# Patient Record
Sex: Female | Born: 1965 | Race: White | Hispanic: No | Marital: Married | Smoking: Former smoker
Health system: Southern US, Community
[De-identification: ages and names within clinical notes are randomized; demographics above are authoritative.]

## PROBLEM LIST (undated history)

## (undated) DIAGNOSIS — I10 Essential (primary) hypertension: Secondary | ICD-10-CM

## (undated) DIAGNOSIS — IMO0002 Reserved for concepts with insufficient information to code with codable children: Secondary | ICD-10-CM

## (undated) DIAGNOSIS — C539 Malignant neoplasm of cervix uteri, unspecified: Secondary | ICD-10-CM

## (undated) DIAGNOSIS — K219 Gastro-esophageal reflux disease without esophagitis: Secondary | ICD-10-CM

## (undated) DIAGNOSIS — F329 Major depressive disorder, single episode, unspecified: Secondary | ICD-10-CM

## (undated) DIAGNOSIS — Z8669 Personal history of other diseases of the nervous system and sense organs: Secondary | ICD-10-CM

## (undated) DIAGNOSIS — Z862 Personal history of diseases of the blood and blood-forming organs and certain disorders involving the immune mechanism: Secondary | ICD-10-CM

## (undated) DIAGNOSIS — M1990 Unspecified osteoarthritis, unspecified site: Secondary | ICD-10-CM

## (undated) DIAGNOSIS — M419 Scoliosis, unspecified: Secondary | ICD-10-CM

## (undated) DIAGNOSIS — F32A Depression, unspecified: Secondary | ICD-10-CM

## (undated) DIAGNOSIS — J189 Pneumonia, unspecified organism: Secondary | ICD-10-CM

## (undated) DIAGNOSIS — N809 Endometriosis, unspecified: Secondary | ICD-10-CM

## (undated) DIAGNOSIS — J40 Bronchitis, not specified as acute or chronic: Secondary | ICD-10-CM

## (undated) DIAGNOSIS — F319 Bipolar disorder, unspecified: Secondary | ICD-10-CM

## (undated) HISTORY — PX: OTHER SURGICAL HISTORY: SHX169

## (undated) HISTORY — PX: ELBOW SURGERY: SHX618

---

## 1990-12-09 HISTORY — PX: CHOLECYSTECTOMY: SHX55

## 1999-09-14 ENCOUNTER — Other Ambulatory Visit: Admission: RE | Admit: 1999-09-14 | Discharge: 1999-09-14 | Payer: Self-pay | Admitting: Obstetrics and Gynecology

## 2011-03-30 ENCOUNTER — Emergency Department (HOSPITAL_COMMUNITY)
Admission: EM | Admit: 2011-03-30 | Discharge: 2011-03-30 | Disposition: A | Discharge status: Home / Self Care | Payer: 59 | Attending: Emergency Medicine | Admitting: Emergency Medicine

## 2011-03-30 ENCOUNTER — Emergency Department (HOSPITAL_COMMUNITY): Payer: 59

## 2011-03-30 DIAGNOSIS — E119 Type 2 diabetes mellitus without complications: Secondary | ICD-10-CM | POA: Insufficient documentation

## 2011-03-30 DIAGNOSIS — B9789 Other viral agents as the cause of diseases classified elsewhere: Secondary | ICD-10-CM | POA: Insufficient documentation

## 2011-03-30 DIAGNOSIS — R5381 Other malaise: Secondary | ICD-10-CM | POA: Insufficient documentation

## 2011-03-30 DIAGNOSIS — E669 Obesity, unspecified: Secondary | ICD-10-CM | POA: Insufficient documentation

## 2011-03-30 DIAGNOSIS — R509 Fever, unspecified: Secondary | ICD-10-CM | POA: Insufficient documentation

## 2011-03-30 DIAGNOSIS — R51 Headache: Secondary | ICD-10-CM | POA: Insufficient documentation

## 2011-03-30 DIAGNOSIS — R630 Anorexia: Secondary | ICD-10-CM | POA: Insufficient documentation

## 2011-03-30 DIAGNOSIS — Z79899 Other long term (current) drug therapy: Secondary | ICD-10-CM | POA: Insufficient documentation

## 2011-03-30 LAB — URINALYSIS, ROUTINE W REFLEX MICROSCOPIC
Ketones, ur: 15 mg/dL — AB
Nitrite: NEGATIVE
Specific Gravity, Urine: 1.023 (ref 1.005–1.030)
pH: 8.5 — ABNORMAL HIGH (ref 5.0–8.0)

## 2011-03-30 LAB — DIFFERENTIAL
Basophils Absolute: 0 10*3/uL (ref 0.0–0.1)
Basophils Relative: 1 % (ref 0–1)
Eosinophils Absolute: 0 10*3/uL (ref 0.0–0.7)
Neutro Abs: 3.3 10*3/uL (ref 1.7–7.7)
Neutrophils Relative %: 70 % (ref 43–77)

## 2011-03-30 LAB — CBC
Hemoglobin: 13.1 g/dL (ref 12.0–15.0)
Platelets: 221 10*3/uL (ref 150–400)
RBC: 4.48 MIL/uL (ref 3.87–5.11)
WBC: 4.7 10*3/uL (ref 4.0–10.5)

## 2011-03-30 LAB — BASIC METABOLIC PANEL
BUN: 6 mg/dL (ref 6–23)
GFR calc Af Amer: >60 mL/min (ref >60)
GFR calc non Af Amer: >60 mL/min (ref >60)
Potassium: 3.6 mEq/L (ref 3.5–5.1)
Sodium: 131 mEq/L — ABNORMAL LOW (ref 135–145)

## 2011-03-30 LAB — POCT PREGNANCY, URINE: Preg Test, Ur: NEGATIVE

## 2011-09-13 ENCOUNTER — Ambulatory Visit
Admission: RE | Admit: 2011-09-13 | Discharge: 2011-09-13 | Disposition: A | Discharge status: Home / Self Care | Payer: 59 | Source: Ambulatory Visit | Attending: Family Medicine | Admitting: Family Medicine

## 2011-09-13 ENCOUNTER — Other Ambulatory Visit: Payer: Self-pay | Admitting: Family Medicine

## 2011-09-13 DIAGNOSIS — M25551 Pain in right hip: Secondary | ICD-10-CM

## 2012-01-24 ENCOUNTER — Other Ambulatory Visit: Payer: Self-pay | Admitting: Neurosurgery

## 2012-01-24 DIAGNOSIS — M5410 Radiculopathy, site unspecified: Secondary | ICD-10-CM

## 2012-01-30 ENCOUNTER — Other Ambulatory Visit: Payer: 59

## 2012-01-31 ENCOUNTER — Ambulatory Visit
Admission: RE | Admit: 2012-01-31 | Discharge: 2012-01-31 | Disposition: A | Discharge status: Home / Self Care | Payer: Medicare Other | Source: Ambulatory Visit | Attending: Neurosurgery | Admitting: Neurosurgery

## 2012-01-31 VITALS — BP 102/61 | HR 75

## 2012-01-31 DIAGNOSIS — M5410 Radiculopathy, site unspecified: Secondary | ICD-10-CM

## 2012-01-31 MED ORDER — ONDANSETRON HCL 4 MG/2ML IJ SOLN
4.0000 mg | Freq: Once | INTRAMUSCULAR | Status: AC
  Administered 2012-01-31: 4 mg via INTRAMUSCULAR

## 2012-01-31 MED ORDER — DIAZEPAM 5 MG PO TABS
10.0000 mg | ORAL_TABLET | Freq: Once | ORAL | Status: AC
  Administered 2012-01-31: 10 mg via ORAL

## 2012-01-31 MED ORDER — HYDROMORPHONE HCL PF 2 MG/ML IJ SOLN
2.0000 mg | Freq: Once | INTRAMUSCULAR | Status: AC
  Administered 2012-01-31: 2 mg via INTRAMUSCULAR

## 2012-01-31 NOTE — Progress Notes (Signed)
Pt has been off prestig for the past 2 days.  Discharge instructions explained, consent signed and valium given.

## 2012-02-10 ENCOUNTER — Encounter (HOSPITAL_COMMUNITY): Payer: Self-pay | Admitting: Pharmacy Technician

## 2012-02-10 NOTE — Progress Notes (Signed)
Left message for Erie Noe at Dr. Trudee Grip office requesting orders.

## 2012-02-11 ENCOUNTER — Encounter (HOSPITAL_COMMUNITY)
Admission: RE | Admit: 2012-02-11 | Discharge: 2012-02-11 | Disposition: A | Discharge status: Home / Self Care | Payer: 59 | Source: Ambulatory Visit | Attending: Neurosurgery | Admitting: Neurosurgery

## 2012-02-11 ENCOUNTER — Encounter (HOSPITAL_COMMUNITY): Payer: Self-pay

## 2012-02-11 ENCOUNTER — Other Ambulatory Visit: Payer: Self-pay

## 2012-02-11 HISTORY — DX: Bipolar disorder, unspecified: F31.9

## 2012-02-11 HISTORY — DX: Reserved for concepts with insufficient information to code with codable children: IMO0002

## 2012-02-11 HISTORY — DX: Depression, unspecified: F32.A

## 2012-02-11 HISTORY — DX: Major depressive disorder, single episode, unspecified: F32.9

## 2012-02-11 LAB — CBC
MCHC: 33.2 g/dL (ref 30.0–36.0)
Platelets: 281 10*3/uL (ref 150–400)
RDW: 13.5 % (ref 11.5–15.5)

## 2012-02-11 LAB — DIFFERENTIAL
Basophils Absolute: 0 10*3/uL (ref 0.0–0.1)
Basophils Relative: 1 % (ref 0–1)
Eosinophils Relative: 3 % (ref 0–5)
Monocytes Absolute: 1 10*3/uL (ref 0.1–1.0)

## 2012-02-11 LAB — TYPE AND SCREEN
ABO/RH(D): O POS
Antibody Screen: NEGATIVE

## 2012-02-11 LAB — BASIC METABOLIC PANEL
BUN: 12 mg/dL (ref 6–23)
Creatinine, Ser: 0.57 mg/dL (ref 0.50–1.10)
GFR calc Af Amer: >90 mL/min (ref >90)
GFR calc non Af Amer: >90 mL/min (ref >90)
Potassium: 4 mEq/L (ref 3.5–5.1)

## 2012-02-11 LAB — SURGICAL PCR SCREEN: MRSA, PCR: NEGATIVE

## 2012-02-11 NOTE — Pre-Procedure Instructions (Signed)
20 Valerie Diaz  02/11/2012   Your procedure is scheduled on:  Friday March 15  Report to [facility] at 5:30 AM.  Call this number if you have problems the morning of surgery: [phone]   Remember:   Do not eat food:After Midnight.  May have clear liquids: up to 4 Hours before arrival.  Clear liquids include soda, tea, black coffee, apple or grape juice, broth.  Take these medicines the morning of surgery with A SIP OF WATER: Depakote, Hydrocodone,    Do not wear jewelry, make-up or nail polish.  Do not wear lotions, powders, or perfumes. You may wear deodorant.  Do not shave 48 hours prior to surgery.  Do not bring valuables to the hospital.  Contacts, dentures or bridgework may not be worn into surgery.  Leave suitcase in the car. After surgery it may be brought to your room.  For patients admitted to the hospital, checkout time is 11:00 AM the day of discharge.   Patients discharged the day of surgery will not be allowed to drive home.  Name and phone number of your driver: NA  Special Instructions: CHG Shower Use Special Wash: 1/2 bottle night before surgery and 1/2 bottle morning of surgery.   Please read over the following fact sheets that you were given: Pain Booklet, Coughing and Deep Breathing, Blood Transfusion Information and Surgical Site Infection Prevention

## 2012-02-12 NOTE — Consult Note (Addendum)
Anesthesia:  Patient is a 46 year old female scheduled for a right L3-4, L4-5 decompression laminectomy, TLIF on 02/21/12.  Other history includes DM2, Bipolar disorder, depression, non-smoker.  Labs noted.  Glucose 166, otherwise CBC/BMET WNL.    CXR from 03/30/11 showed no active disease.  EKG from 02/11/12 shows NSR, minimal voltage criteria for LVH (may be normal variant), cannot rule out anterior infarct (age undetermined). Currently, there are no comparison EKGs.  No CV symptoms reported at her PAT appointment.  Will attempt to contact her pre-operatively to clinically correlate.  Addendum:  02/13/12 1800  Left message for patient to call me.

## 2012-02-20 MED ORDER — SODIUM CHLORIDE 0.9 % IV SOLN
1500.0000 mg | Freq: Once | INTRAVENOUS | Status: AC
  Administered 2012-02-21: 1500 mg via INTRAVENOUS
  Filled 2012-02-20: qty 1500

## 2012-02-20 MED ORDER — DEXAMETHASONE SODIUM PHOSPHATE 10 MG/ML IJ SOLN
10.0000 mg | Freq: Once | INTRAMUSCULAR | Status: AC
  Administered 2012-02-21: 10 mg via INTRAVENOUS
  Filled 2012-02-20: qty 1

## 2012-02-21 ENCOUNTER — Encounter (HOSPITAL_COMMUNITY)
Admission: RE | Disposition: A | Discharge status: Home Health | Payer: Self-pay | Source: Ambulatory Visit | Attending: Neurosurgery

## 2012-02-21 ENCOUNTER — Inpatient Hospital Stay (HOSPITAL_COMMUNITY): Payer: 59

## 2012-02-21 ENCOUNTER — Inpatient Hospital Stay (HOSPITAL_COMMUNITY)
Admission: RE | Admit: 2012-02-21 | Discharge: 2012-02-26 | DRG: 460 | Disposition: A | Discharge status: Home Health | Payer: 59 | Source: Ambulatory Visit | Attending: Neurosurgery | Admitting: Neurosurgery

## 2012-02-21 ENCOUNTER — Inpatient Hospital Stay (HOSPITAL_COMMUNITY): Payer: 59 | Admitting: Vascular Surgery

## 2012-02-21 ENCOUNTER — Encounter (HOSPITAL_COMMUNITY): Payer: Self-pay | Admitting: Vascular Surgery

## 2012-02-21 DIAGNOSIS — M47817 Spondylosis without myelopathy or radiculopathy, lumbosacral region: Principal | ICD-10-CM | POA: Diagnosis present

## 2012-02-21 DIAGNOSIS — E119 Type 2 diabetes mellitus without complications: Secondary | ICD-10-CM | POA: Diagnosis present

## 2012-02-21 DIAGNOSIS — Z880 Allergy status to penicillin: Secondary | ICD-10-CM

## 2012-02-21 DIAGNOSIS — Z91013 Allergy to seafood: Secondary | ICD-10-CM

## 2012-02-21 DIAGNOSIS — M48061 Spinal stenosis, lumbar region without neurogenic claudication: Secondary | ICD-10-CM

## 2012-02-21 DIAGNOSIS — F319 Bipolar disorder, unspecified: Secondary | ICD-10-CM | POA: Diagnosis present

## 2012-02-21 DIAGNOSIS — Z79899 Other long term (current) drug therapy: Secondary | ICD-10-CM

## 2012-02-21 DIAGNOSIS — G8918 Other acute postprocedural pain: Secondary | ICD-10-CM | POA: Diagnosis not present

## 2012-02-21 LAB — GLUCOSE, CAPILLARY: Glucose-Capillary: 217 mg/dL — ABNORMAL HIGH (ref 70–99)

## 2012-02-21 SURGERY — MINIMALLY INVASIVE (MIS) TRANSFORAMINAL LUMBAR INTERBODY FUSION (TLIF) 2 LEVEL
Anesthesia: General | Site: Back | Laterality: Right | Wound class: Clean

## 2012-02-21 MED ORDER — HYDROMORPHONE HCL PF 1 MG/ML IJ SOLN
INTRAMUSCULAR | Status: AC
  Administered 2012-02-21: 1 mg via INTRAMUSCULAR
  Filled 2012-02-21: qty 1

## 2012-02-21 MED ORDER — VENLAFAXINE HCL ER 37.5 MG PO CP24: 37.5000 mg | ORAL_CAPSULE | Freq: Every day | ORAL | Status: DC

## 2012-02-21 MED ORDER — SODIUM CHLORIDE 0.9 % IV SOLN
1250.0000 mg | Freq: Once | INTRAVENOUS | Status: AC
  Administered 2012-02-21: 1250 mg via INTRAVENOUS
  Filled 2012-02-21 (×2): qty 1250

## 2012-02-21 MED ORDER — DEXAMETHASONE SODIUM PHOSPHATE 4 MG/ML IJ SOLN: 4.0000 mg | Freq: Four times a day (QID) | INTRAMUSCULAR | Status: AC

## 2012-02-21 MED ORDER — LACTATED RINGERS IV SOLN
INTRAVENOUS | Status: DC | PRN
  Administered 2012-02-21 (×4): via INTRAVENOUS

## 2012-02-21 MED ORDER — PHENYLEPHRINE HCL 10 MG/ML IJ SOLN
INTRAMUSCULAR | Status: DC | PRN
  Administered 2012-02-21 (×8): 80 ug via INTRAVENOUS

## 2012-02-21 MED ORDER — DEXAMETHASONE 4 MG PO TABS
4.0000 mg | ORAL_TABLET | Freq: Four times a day (QID) | ORAL | Status: AC
  Administered 2012-02-21 (×2): 4 mg via ORAL
  Filled 2012-02-21 (×2): qty 1

## 2012-02-21 MED ORDER — POTASSIUM CHLORIDE IN NACL 20-0.45 MEQ/L-% IV SOLN
INTRAVENOUS | Status: DC
  Administered 2012-02-21: 22:00:00 via INTRAVENOUS
  Filled 2012-02-21 (×11): qty 1000

## 2012-02-21 MED ORDER — PHENOL 1.4 % MT LIQD: 1.0000 | OROMUCOSAL | Status: DC | PRN

## 2012-02-21 MED ORDER — SODIUM CHLORIDE 0.9 % IJ SOLN: 3.0000 mL | INTRAMUSCULAR | Status: DC | PRN

## 2012-02-21 MED ORDER — MORPHINE SULFATE 2 MG/ML IJ SOLN: 0.0500 mg/kg | INTRAMUSCULAR | Status: DC | PRN

## 2012-02-21 MED ORDER — METFORMIN HCL 500 MG PO TABS
1000.0000 mg | ORAL_TABLET | Freq: Two times a day (BID) | ORAL | Status: DC
  Administered 2012-02-21 – 2012-02-26 (×10): 1000 mg via ORAL
  Filled 2012-02-21 (×12): qty 2

## 2012-02-21 MED ORDER — HYDROMORPHONE HCL PF 1 MG/ML IJ SOLN
0.2500 mg | INTRAMUSCULAR | Status: DC | PRN
  Administered 2012-02-21 (×4): 0.5 mg via INTRAVENOUS

## 2012-02-21 MED ORDER — ONDANSETRON HCL 4 MG/2ML IJ SOLN: 4.0000 mg | Freq: Once | INTRAMUSCULAR | Status: DC | PRN

## 2012-02-21 MED ORDER — DIAZEPAM 5 MG PO TABS
ORAL_TABLET | ORAL | Status: AC
  Administered 2012-02-21: 5 mg via ORAL
  Filled 2012-02-21: qty 1

## 2012-02-21 MED ORDER — HYDROMORPHONE HCL PF 1 MG/ML IJ SOLN
INTRAMUSCULAR | Status: AC
  Administered 2012-02-21: 0.5 mg via INTRAVENOUS
  Filled 2012-02-21: qty 1

## 2012-02-21 MED ORDER — LEUPROLIDE ACETATE 3.75 MG IM KIT: 3.7500 mg | PACK | INTRAMUSCULAR | Status: DC

## 2012-02-21 MED ORDER — INSULIN ASPART 100 UNIT/ML ~~LOC~~ SOLN
0.0000 [IU] | Freq: Every day | SUBCUTANEOUS | Status: DC
  Administered 2012-02-21: 3 [IU] via SUBCUTANEOUS
  Administered 2012-02-24: 2 [IU] via SUBCUTANEOUS
  Filled 2012-02-21: qty 3
  Filled 2012-02-21: qty 0.05

## 2012-02-21 MED ORDER — ACETAMINOPHEN 650 MG RE SUPP: 650.0000 mg | RECTAL | Status: DC | PRN

## 2012-02-21 MED ORDER — SODIUM CHLORIDE 0.9 % IR SOLN
Status: DC | PRN
  Administered 2012-02-21: 09:00:00

## 2012-02-21 MED ORDER — NEOSTIGMINE METHYLSULFATE 1 MG/ML IJ SOLN
INTRAMUSCULAR | Status: DC | PRN
  Administered 2012-02-21: 5 mg via INTRAVENOUS

## 2012-02-21 MED ORDER — HETASTARCH-ELECTROLYTES 6 % IV SOLN
INTRAVENOUS | Status: DC | PRN
  Administered 2012-02-21: 10:00:00 via INTRAVENOUS

## 2012-02-21 MED ORDER — SUCCINYLCHOLINE CHLORIDE 20 MG/ML IJ SOLN
INTRAMUSCULAR | Status: DC | PRN
  Administered 2012-02-21: 100 mg via INTRAVENOUS

## 2012-02-21 MED ORDER — ROCURONIUM BROMIDE 100 MG/10ML IV SOLN
INTRAVENOUS | Status: DC | PRN
  Administered 2012-02-21: 20 mg via INTRAVENOUS
  Administered 2012-02-21: 30 mg via INTRAVENOUS
  Administered 2012-02-21: 50 mg via INTRAVENOUS
  Administered 2012-02-21: 10 mg via INTRAVENOUS

## 2012-02-21 MED ORDER — SODIUM CHLORIDE 0.9 % IV SOLN: 250.0000 mL | INTRAVENOUS | Status: DC

## 2012-02-21 MED ORDER — MIDAZOLAM HCL 5 MG/5ML IJ SOLN
INTRAMUSCULAR | Status: DC | PRN
  Administered 2012-02-21: 2 mg via INTRAVENOUS

## 2012-02-21 MED ORDER — ONDANSETRON HCL 4 MG/2ML IJ SOLN
INTRAMUSCULAR | Status: DC | PRN
  Administered 2012-02-21 (×2): 4 mg via INTRAVENOUS

## 2012-02-21 MED ORDER — INSULIN ASPART 100 UNIT/ML ~~LOC~~ SOLN
6.0000 [IU] | Freq: Three times a day (TID) | SUBCUTANEOUS | Status: DC
  Administered 2012-02-22 – 2012-02-25 (×7): 6 [IU] via SUBCUTANEOUS
  Filled 2012-02-21: qty 0.06
  Filled 2012-02-21: qty 3

## 2012-02-21 MED ORDER — HYDROCODONE-ACETAMINOPHEN 5-325 MG PO TABS
1.0000 | ORAL_TABLET | ORAL | Status: DC | PRN
  Administered 2012-02-21 – 2012-02-25 (×11): 2 via ORAL
  Filled 2012-02-21 (×13): qty 2

## 2012-02-21 MED ORDER — BUPIVACAINE HCL (PF) 0.5 % IJ SOLN
INTRAMUSCULAR | Status: DC | PRN
  Administered 2012-02-21: 34 mL

## 2012-02-21 MED ORDER — SODIUM CHLORIDE 0.9 % IJ SOLN
3.0000 mL | Freq: Two times a day (BID) | INTRAMUSCULAR | Status: DC
  Administered 2012-02-22 – 2012-02-26 (×9): 3 mL via INTRAVENOUS

## 2012-02-21 MED ORDER — MENTHOL 3 MG MT LOZG: 1.0000 | LOZENGE | OROMUCOSAL | Status: DC | PRN

## 2012-02-21 MED ORDER — ONDANSETRON HCL 4 MG/2ML IJ SOLN: 4.0000 mg | INTRAMUSCULAR | Status: DC | PRN

## 2012-02-21 MED ORDER — VENLAFAXINE HCL ER 75 MG PO CP24
75.0000 mg | ORAL_CAPSULE | Freq: Every day | ORAL | Status: DC
  Administered 2012-02-22 – 2012-02-26 (×5): 75 mg via ORAL
  Filled 2012-02-21 (×6): qty 1

## 2012-02-21 MED ORDER — EPHEDRINE SULFATE 50 MG/ML IJ SOLN
INTRAMUSCULAR | Status: DC | PRN
  Administered 2012-02-21: 5 mg via INTRAVENOUS
  Administered 2012-02-21: 10 mg via INTRAVENOUS
  Administered 2012-02-21: 7.5 mg via INTRAVENOUS
  Administered 2012-02-21: 5 mg via INTRAVENOUS

## 2012-02-21 MED ORDER — ACETAMINOPHEN 325 MG PO TABS: 650.0000 mg | ORAL_TABLET | ORAL | Status: DC | PRN

## 2012-02-21 MED ORDER — DIVALPROEX SODIUM 500 MG PO DR TAB
1500.0000 mg | DELAYED_RELEASE_TABLET | Freq: Every day | ORAL | Status: DC
  Administered 2012-02-21 – 2012-02-25 (×5): 1500 mg via ORAL
  Filled 2012-02-21 (×6): qty 3

## 2012-02-21 MED ORDER — SODIUM CHLORIDE 0.9 % IV SOLN
INTRAVENOUS | Status: AC
  Filled 2012-02-21: qty 500

## 2012-02-21 MED ORDER — INSULIN ASPART 100 UNIT/ML ~~LOC~~ SOLN
0.0000 [IU] | Freq: Three times a day (TID) | SUBCUTANEOUS | Status: DC
  Administered 2012-02-22 – 2012-02-23 (×4): 3 [IU] via SUBCUTANEOUS
  Administered 2012-02-23: 4 [IU] via SUBCUTANEOUS
  Administered 2012-02-25: 7 [IU] via SUBCUTANEOUS
  Filled 2012-02-21: qty 0.2
  Filled 2012-02-21: qty 3

## 2012-02-21 MED ORDER — LIDOCAINE HCL 4 % MT SOLN
OROMUCOSAL | Status: DC | PRN
  Administered 2012-02-21: 4 mL via TOPICAL

## 2012-02-21 MED ORDER — HEMOSTATIC AGENTS (NO CHARGE) OPTIME
TOPICAL | Status: DC | PRN
  Administered 2012-02-21: 1 via TOPICAL

## 2012-02-21 MED ORDER — PROPOFOL 10 MG/ML IV EMUL
INTRAVENOUS | Status: DC | PRN
  Administered 2012-02-21: 200 mg via INTRAVENOUS

## 2012-02-21 MED ORDER — DIAZEPAM 5 MG PO TABS
5.0000 mg | ORAL_TABLET | Freq: Four times a day (QID) | ORAL | Status: DC | PRN
  Administered 2012-02-21 – 2012-02-25 (×6): 5 mg via ORAL
  Filled 2012-02-21 (×6): qty 1

## 2012-02-21 MED ORDER — GLIPIZIDE 5 MG PO TABS
5.0000 mg | ORAL_TABLET | Freq: Every day | ORAL | Status: DC
  Administered 2012-02-22 – 2012-02-26 (×5): 5 mg via ORAL
  Filled 2012-02-21 (×6): qty 1

## 2012-02-21 MED ORDER — 0.9 % SODIUM CHLORIDE (POUR BTL) OPTIME
TOPICAL | Status: DC | PRN
  Administered 2012-02-21: 1000 mL

## 2012-02-21 MED ORDER — HYDROMORPHONE HCL PF 1 MG/ML IJ SOLN
1.0000 mg | INTRAMUSCULAR | Status: DC | PRN
  Administered 2012-02-21 – 2012-02-22 (×4): 1 mg via INTRAMUSCULAR
  Filled 2012-02-21 (×3): qty 1

## 2012-02-21 MED ORDER — GLYCOPYRROLATE 0.2 MG/ML IJ SOLN
INTRAMUSCULAR | Status: DC | PRN
  Administered 2012-02-21: .8 mg via INTRAVENOUS

## 2012-02-21 MED ORDER — BACITRACIN 50000 UNITS IM SOLR
INTRAMUSCULAR | Status: AC
  Filled 2012-02-21: qty 1

## 2012-02-21 MED ORDER — THROMBIN 5000 UNITS EX KIT
PACK | CUTANEOUS | Status: DC | PRN
  Administered 2012-02-21 (×4): 5000 [IU] via TOPICAL

## 2012-02-21 MED ORDER — SUFENTANIL CITRATE 50 MCG/ML IV SOLN
INTRAVENOUS | Status: DC | PRN
  Administered 2012-02-21 (×4): 10 ug via INTRAVENOUS
  Administered 2012-02-21: 30 ug via INTRAVENOUS
  Administered 2012-02-21 (×3): 10 ug via INTRAVENOUS

## 2012-02-21 SURGICAL SUPPLY — 81 items
ADH SKN CLS APL DERMABOND .7 (GAUZE/BANDAGES/DRESSINGS) ×1
APL SKNCLS STERI-STRIP NONHPOA (GAUZE/BANDAGES/DRESSINGS) ×2
BAG DECANTER FOR FLEXI CONT (MISCELLANEOUS) ×2 IMPLANT
BENZOIN TINCTURE PRP APPL 2/3 (GAUZE/BANDAGES/DRESSINGS) ×3 IMPLANT
BLADE SURG ROTATE 9660 (MISCELLANEOUS) IMPLANT
BONE EQUIVA 5CC (Bone Implant) ×1 IMPLANT
BRUSH SCRUB EZ PLAIN DRY (MISCELLANEOUS) ×2 IMPLANT
BUR CUTTER 7.0 ROUND (BURR) ×1 IMPLANT
BUR MATCHSTICK NEURO 3.0 LAGG (BURR) ×1 IMPLANT
CLIP NEUROVISION LG (CLIP) ×1 IMPLANT
CLOTH BEACON ORANGE TIMEOUT ST (SAFETY) ×2 IMPLANT
CONT SPEC 4OZ CLIKSEAL STRL BL (MISCELLANEOUS) ×2 IMPLANT
COVER BACK TABLE 24X17X13 BIG (DRAPES) IMPLANT
COVER TABLE BACK 60X90 (DRAPES) ×2 IMPLANT
DERMABOND ADVANCED (GAUZE/BANDAGES/DRESSINGS) ×1
DERMABOND ADVANCED .7 DNX12 (GAUZE/BANDAGES/DRESSINGS) IMPLANT
DRAPE C-ARM 42X72 X-RAY (DRAPES) ×2 IMPLANT
DRAPE C-ARMOR (DRAPES) ×2 IMPLANT
DRAPE LAPAROTOMY 100X72X124 (DRAPES) ×2 IMPLANT
DRAPE SURG 17X23 STRL (DRAPES) ×4 IMPLANT
DRESSING TELFA 8X3 (GAUZE/BANDAGES/DRESSINGS) ×2 IMPLANT
DYNAMIC STIM CLIP ×1 IMPLANT
ELECT BLADE 4.0 EZ CLEAN MEGAD (MISCELLANEOUS) ×2
ELECT REM PT RETURN 9FT ADLT (ELECTROSURGICAL) ×2
ELECTRODE BLDE 4.0 EZ CLN MEGD (MISCELLANEOUS) ×1 IMPLANT
ELECTRODE REM PT RTRN 9FT ADLT (ELECTROSURGICAL) ×1 IMPLANT
EVACUATOR 1/8 PVC DRAIN (DRAIN) ×1 IMPLANT
GAUZE SPONGE 4X4 16PLY XRAY LF (GAUZE/BANDAGES/DRESSINGS) ×2 IMPLANT
GLOVE BIO SURGEON STRL SZ8 (GLOVE) IMPLANT
GLOVE BIOGEL PI IND STRL 6.5 (GLOVE) IMPLANT
GLOVE BIOGEL PI IND STRL 7.0 (GLOVE) IMPLANT
GLOVE BIOGEL PI INDICATOR 6.5 (GLOVE) ×1
GLOVE BIOGEL PI INDICATOR 7.0 (GLOVE) ×3
GLOVE ECLIPSE 7.5 STRL STRAW (GLOVE) ×2 IMPLANT
GLOVE ECLIPSE 8.5 STRL (GLOVE) ×1 IMPLANT
GLOVE EXAM NITRILE LRG STRL (GLOVE) IMPLANT
GLOVE EXAM NITRILE MD LF STRL (GLOVE) ×1 IMPLANT
GLOVE EXAM NITRILE XL STR (GLOVE) ×1 IMPLANT
GLOVE EXAM NITRILE XS STR PU (GLOVE) IMPLANT
GLOVE SS BIOGEL STRL SZ 6.5 (GLOVE) IMPLANT
GLOVE SUPERSENSE BIOGEL SZ 6.5 (GLOVE) ×3
GLOVE SURG SS PI 6.5 STRL IVOR (GLOVE) ×5 IMPLANT
GOWN BRE IMP SLV AUR LG STRL (GOWN DISPOSABLE) ×4 IMPLANT
GOWN BRE IMP SLV AUR XL STRL (GOWN DISPOSABLE) ×4 IMPLANT
GOWN STRL REIN 2XL LVL4 (GOWN DISPOSABLE) ×2 IMPLANT
GUIDEWIRE NITINOL BEVEL TIP (WIRE) ×3 IMPLANT
HOOP SHIMS ×2 IMPLANT
KIT BASIN OR (CUSTOM PROCEDURE TRAY) ×2 IMPLANT
KIT NDL NVM5 EMG ELECT (KITS) IMPLANT
KIT NEEDLE NVM5 EMG ELECT (KITS) ×1 IMPLANT
KIT NEEDLE NVM5 EMG ELECTRODE (KITS) ×1
KIT ROOM TURNOVER OR (KITS) ×2 IMPLANT
LIGHT SOURCE ANGLE TIP STR 7FT (MISCELLANEOUS) ×2 IMPLANT
LUCENT STRAIGHT 10X22X8 (Set) ×2 IMPLANT
MAS TLIF HOOP SHIM (KITS) ×2 IMPLANT
NDL I-PASS III (NEEDLE) IMPLANT
NEEDLE HYPO 22GX1.5 SAFETY (NEEDLE) ×2 IMPLANT
NEEDLE I-PASS III (NEEDLE) ×2 IMPLANT
NS IRRIG 1000ML POUR BTL (IV SOLUTION) ×2 IMPLANT
PACK LAMINECTOMY NEURO (CUSTOM PROCEDURE TRAY) ×2 IMPLANT
PROBE ×1 IMPLANT
PROBE BALL TIP NVM5 SNG USE (BALLOONS) ×2 IMPLANT
ROD DUAL BALL 42.5MM (Rod) ×2 IMPLANT
ROD PRE BENT SPHERX 70MM (Rod) ×1 IMPLANT
SCREW CANNULATED 6.5X45 (Screw) ×3 IMPLANT
SCREW LOCK IMPLANT 6.25 (Screw) ×12 IMPLANT
SCREW SHANK 40MMX6.5 (Screw) ×3 IMPLANT
SCREW TULIP IMPLANT 6.25 (Screw) ×2 IMPLANT
SPONGE GAUZE 4X4 12PLY (GAUZE/BANDAGES/DRESSINGS) ×2 IMPLANT
SPONGE LAP 4X18 X RAY DECT (DISPOSABLE) IMPLANT
SPONGE SURGIFOAM ABS GEL SZ50 (HEMOSTASIS) ×2 IMPLANT
STAPLER SKIN PROX WIDE 3.9 (STAPLE) ×1 IMPLANT
STRIP CLOSURE SKIN 1/2X4 (GAUZE/BANDAGES/DRESSINGS) ×1 IMPLANT
SUT VIC AB 2-0 OS6 18 (SUTURE) ×10 IMPLANT
SUT VIC AB 3-0 CP2 18 (SUTURE) ×2 IMPLANT
SYR 20ML ECCENTRIC (SYRINGE) ×2 IMPLANT
TOWEL OR 17X24 6PK STRL BLUE (TOWEL DISPOSABLE) ×2 IMPLANT
TOWEL OR 17X26 10 PK STRL BLUE (TOWEL DISPOSABLE) ×2 IMPLANT
TRAP SPECIMEN MUCOUS 40CC (MISCELLANEOUS) ×1 IMPLANT
TRAY FOLEY CATH 14FRSI W/METER (CATHETERS) ×1 IMPLANT
WATER STERILE IRR 1000ML POUR (IV SOLUTION) ×2 IMPLANT

## 2012-02-21 NOTE — Progress Notes (Signed)
ANTIBIOTIC CONSULT NOTE - INITIAL  Pharmacy Consult for Vancomycin post-op  Indication: Post-Op Spinal Surgery Empiric Antibitotic Prophylaxis  Allergies  Allergen Reactions  . Shellfish Allergy Hives and Shortness Of Breath    Throat swelling  . Penicillins Hives    Patient Measurements: Weight: 247 lb (112.038 kg)  Vital Signs: Temp: 97.8 F (36.6 C) (03/15 1615) Temp src: Oral (03/15 0609) BP: 106/67 mmHg (03/15 1615) Pulse Rate: 113  (03/15 1615) Intake/Output from previous day:   Intake/Output from this shift: Total I/O In: 3800 [I.V.:3300; IV Piggyback:500] Out: 1250 [Urine:550; Blood:700]  Labs: No results found for this basename: WBC:3,HGB:3,PLT:3,LABCREA:3,CREATININE:3 in the last 72 hours CrCl is unknown because there is no height on file for the current visit. No results found for this basename: VANCOTROUGH:2,VANCOPEAK:2,VANCORANDOM:2,GENTTROUGH:2,GENTPEAK:2,GENTRANDOM:2,TOBRATROUGH:2,TOBRAPEAK:2,TOBRARND:2,AMIKACINPEAK:2,AMIKACINTROU:2,AMIKACIN:2, in the last 72 hours   Microbiology: Recent Results (from the past 720 hour(s))  SURGICAL PCR SCREEN     Status: Normal   Collection Time   02/11/12  3:29 PM      Component Value Range Status Comment   MRSA, PCR NEGATIVE  NEGATIVE  Final    Staphylococcus aureus NEGATIVE  NEGATIVE  Final     Medical History: Past Medical History  Diagnosis Date  . Diabetes mellitus   . Bulging disc   . Bipolar disorder   . Depression     Medications:  Anti-infectives     Start     Dose/Rate Route Frequency Ordered Stop   02/21/12 0903   bacitracin 50,000 Units in sodium chloride irrigation 0.9 % 500 mL irrigation  Status:  Discontinued          As needed 02/21/12 0903 02/21/12 1313   02/21/12 0729   bacitracin 16109 UNITS injection     Comments: Worthy Flank: cabinet override         02/21/12 0729 02/21/12 1929   02/20/12 1000   vancomycin (VANCOCIN) 1,500 mg in sodium chloride 0.9 % 500 mL IVPB        1,500  mg 250 mL/hr over 120 Minutes Intravenous  Once 02/20/12 0945 02/21/12 0808         Assessment: 46 year old female s/p Right L3-4  L4-5 lift/interbody spacing and Left L3-4 L4-5 screw fixation for spondylosis and stenosis to receive Vancomycin x1 dose post-op for antibiotic prophylaxis. No drain was placed. AET = 1320. Pre-op vancomycin 1500mg  given at 0808. Afebrile. No culture data.  WBC wnl on 02/11/12. SCr 0.57 on 02/11/12.  No repeat labs available.   Plan:  1. Vancomycin 1250mg  IV x1 at 2000.  2. Pharmacy will sign off. Please re-consult if needed.   Link Snuffer, PharmD, BCPS Clinical Pharmacist [phone] 02/21/2012,5:42 PM

## 2012-02-21 NOTE — Op Note (Signed)
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 lift with peek interbody spacer followed by left L3-4 L4-5 percutaneous pedicle screw fixation with invasive percutaneous pedicle screw system Surgeon: Berdie Malter Assistant: Pool  After being placed in the prone position the patient's back was prepped and draped in the usual sterile fashion. Localizing fluoroscopy was used prior to incision to identify the appropriate level. Entry points were made for Jamshidi needles lateral to the pedicle at L3 and L4. Guidewire was placed through the Jamshidi needles were still followed in good position. The incision was connected and then sequential dilation was carried out over the guidewires. We placed 6 5 x 40 mm screws on that side with the retractor blades attached. We attached the retractor in standard fashion. We then secured the retractor to the table. We then cleaned of soft tissue on the lamina and facet joint. We then performed a generous laminotomy by removing the inferior 80% of the L3 lamina the medial three quarters of the facet joint and the superior one third of the L4 lamina. Residual bone and ligamentum flavum removed in a piecemeal fashion. We then identified the disc space and thoroughly cleaned out with a variety of pituitary rongeurs and curettes. He was markedly collapsed we distracted up to an 8 mm size and felt that this was a good choice for interbody spacer. We chose an 8 mm peek wrapped and filled with a mixture of autologous bone and the bone and impacted without difficulty. He places a mixture deep within the interspace prior to placing the spacer to help with interbody fusion. We then irrigated copiously controlled the bleeding with upper coagulation and Gelfoam. We then removed the hoop shims from the L3 screw. We removed the retractor completely and then placed a wire down the pedicle of L5. We placed a 6 5 x 40 mm  screw at the left attached and secured to the screws in standard fashion. We then secured the retractor back to the table and distracted and all distract directions. We chose this time that the L4 screw he changes position consistent with a fracture of the pedicle. This was dealt with later in the case. We spent a few minutes defining anatomy L4-5 by removing soft tissue. Once again performed a generous laminotomy by removing the inferior half of the L4 lamina the medial three quarters of the facet joint the superior one third of the L5 lamina. Once again residual bone and ligamentum flavum removed in a piecemeal fashion. At this time the disc L4-5 was entered and thoroughly cleaned out with rongeurs and pituitaries. We then distracted this disc space up to the 8 mm size and felt that this was a good fit this level as well. We used a variety of instruments to decorticate and refill removed the disc. We placed a mixture of autologous bone and morselized allograft into the interspace and then placed a peek interbody spacer this level without difficulty. Fossae showed to be in good position. We then removed the screw from L4 and explored the hole. We felt we could not place of the screws we therefore chose appropriate length dual ball rod and secured to the screws at L3 and L5 on the side. We then did top loading the set screws and secured them with torque and counter torque for final tightening. Farrel Demark showed them to be in excellent position. We then irrigated the wound copiously with antibiotic irrigation. We closed the  wound in multiple layers of Vicryl on the muscle fascia subcutaneous and subcuticular tissues. We then placed percutaneous pedicle screws at L3-L4 and L5 on the left side. This was done in standard fashion patella was attached to 6 5 x 45 mm screws at each level. We then passed the subfascial plane without difficulty and secured to the top of the screws. We did tightening and final tightening and  remove the talus. Final fluoroscopy with excellent. We irrigated once once more and closed in multiple layers of Vicryl. We placed about and Steri-Strips on the skin. Sterile dressings were applied the patient was extubated and covered stable condition.

## 2012-02-21 NOTE — Anesthesia Procedure Notes (Signed)
Procedure Name: Intubation Date/Time: 02/21/2012 7:47 AM Performed by: Glendora Score A Pre-anesthesia Checklist: Patient identified, Emergency Drugs available, Suction available and Patient being monitored Patient Re-evaluated:Patient Re-evaluated prior to inductionOxygen Delivery Method: Circle system utilized Preoxygenation: Pre-oxygenation with 100% oxygen Intubation Type: IV induction and Rapid sequence Laryngoscope Size: Miller and 2 Grade View: Grade I Tube type: Oral Tube size: 7.5 mm Number of attempts: 1 Airway Equipment and Method: Stylet and LTA kit utilized Placement Confirmation: ETT inserted through vocal cords under direct vision,  positive ETCO2 and breath sounds checked- equal and bilateral Secured at: 22 cm Tube secured with: Tape Dental Injury: Teeth and Oropharynx as per pre-operative assessment

## 2012-02-21 NOTE — H&P (Signed)
Valerie Diaz is an 46 y.o. female.   Chief Complaint: Back and right lower extremity pain HPI: The patient is a 46 year old female who is had numerous low back procedures in the past by other surgeons. She now presents with right lower extremity pain as well as back issues. She was tried on conservative therapy without improvement. Imaging studies showed abnormalities within the foramen at L3-4 and L4-5. After discussing the options she requested surgery and outcomes for a two-level minimally invasive T. lift on the right. I had a long discussion with the patient regarding the risks and benefits of surgical intervention. The risks discussed include but are not limited to bleeding infection weakness numbness paralysis spinal fluid leakage trouble with instrumentation nonunion coma and death. We have discussed alternative methods of therapy along with the risks and benefits of nonintervention. She has had the opportunity to ask numerous questions and appears to understand. With this information in hand she has requested we proceed with surgery.  Past Medical History  Diagnosis Date  . Diabetes mellitus   . Bulging disc   . Bipolar disorder   . Depression     Past Surgical History  Procedure Date  . Low back surgery     x 3  . Cholecystectomy 1992  . Elbow surgery     fix tendon on left arm    Family History  Problem Relation Age of Onset  . Anesthesia problems Neg Hx    Social History:  reports that she has never smoked. She does not have any smokeless tobacco history on file. She reports that she does not drink alcohol or use illicit drugs.  Allergies:  Allergies  Allergen Reactions  . Shellfish Allergy Hives and Shortness Of Breath    Throat swelling  . Penicillins Hives    Medications Prior to Admission  Medication Dose Route Frequency Provider Last Rate Last Dose  . bacitracin 96045 UNITS injection           . dexamethasone (DECADRON) injection 10 mg  10 mg Intravenous  Once Reinaldo Meeker, MD      . sodium chloride 0.9 % infusion           . vancomycin (VANCOCIN) 1,500 mg in sodium chloride 0.9 % 500 mL IVPB  1,500 mg Intravenous Once Reinaldo Meeker, MD       No current outpatient prescriptions on file as of 02/21/2012.    Results for orders placed during the hospital encounter of 02/21/12 (from the past 48 hour(s))  HCG, SERUM, QUALITATIVE     Status: Normal   Collection Time   02/21/12  6:01 AM      Component Value Range Comment   Preg, Serum NEGATIVE  NEGATIVE    GLUCOSE, CAPILLARY     Status: Abnormal   Collection Time   02/21/12  6:13 AM      Component Value Range Comment   Glucose-Capillary 129 (*) 70 - 99 (mg/dL)    No results found.  Review of systems not obtained due to patient factors.  Blood pressure 115/84, pulse 96, temperature 97.9 F (36.6 C), temperature source Oral, resp. rate 20, last menstrual period 02/19/2012, SpO2 96.00%.  The patient is awake alert and oriented. she  haass no faciall aasymmmetry.. her gait is not tested this morning. Strength is mildly decreased with dorsiflexion. Assessment/Plan Impression is that of two-level lumbar disease in a patient with multiple previous back procedures. She now comes for a two-level minimally invasive fusion.  Reinaldo Meeker, MD 02/21/2012, 7:30 AM

## 2012-02-21 NOTE — Transfer of Care (Signed)
Immediate Anesthesia Transfer of Care Note  Patient: Valerie Diaz  Procedure(s) Performed: Procedure(s) (LRB): TRANSFORAMINAL LUMBAR INTERBODY FUSION W/ MIS 2 LEVEL (Right)  Patient Location: PACU  Anesthesia Type: General  Level of Consciousness: awake, alert  and patient cooperative  Airway & Oxygen Therapy: Patient Spontanous Breathing and Patient connected to face mask oxygen  Post-op Assessment: Report given to PACU RN  Post vital signs: Reviewed and stable  Complications: No apparent anesthesia complications

## 2012-02-21 NOTE — Preoperative (Signed)
Beta Blockers   Reason not to administer Beta Blockers:Not Applicable 

## 2012-02-21 NOTE — Brief Op Note (Signed)
02/21/2012  1:02 PM  PATIENT:  Valerie Diaz  46 y.o. female  PRE-OPERATIVE DIAGNOSIS:  spondylosis  POST-OPERATIVE DIAGNOSIS:  spondylosis  PROCEDURE:  Procedure(s) (LRB): TRANSFORAMINAL LUMBAR INTERBODY FUSION W/ MIS 2 LEVEL (Right)  SURGEON:  Surgeon(s) and Role:    * Reinaldo Meeker, MD - Primary    * Temple Pacini, MD - Assisting  PHYSICIAN ASSISTANT:   ASSISTANTS: Pool   ANESTHESIA:   general  EBL:  Total I/O In: 3500 [I.V.:3000; IV Piggyback:500] Out: 800 [Urine:100; Blood:700]  BLOOD ADMINISTERED:none  DRAINS: none   LOCAL MEDICATIONS USED:  MARCAINE     SPECIMEN:  No Specimen  DISPOSITION OF SPECIMEN:  N/A  COUNTS:  YES  TOURNIQUET:  * No tourniquets in log *  DICTATION: .Dragon Dictation  PLAN OF CARE: Admit to inpatient   PATIENT DISPOSITION:  PACU - hemodynamically stable.   Delay start of Pharmacological VTE agent (>24hrs) due to surgical blood loss or risk of bleeding: yes

## 2012-02-21 NOTE — Anesthesia Postprocedure Evaluation (Signed)
  Anesthesia Post-op Note  Patient: Valerie Diaz  Procedure(s) Performed: Procedure(s) (LRB): TRANSFORAMINAL LUMBAR INTERBODY FUSION W/ MIS 2 LEVEL (Right)  Patient Location: PACU  Anesthesia Type: General  Level of Consciousness: awake, alert  and oriented  Airway and Oxygen Therapy: Patient Spontanous Breathing and Patient connected to nasal cannula oxygen  Post-op Pain: mild  Post-op Assessment: Post-op Vital signs reviewed, Patient's Cardiovascular Status Stable, Respiratory Function Stable, Patent Airway, No signs of Nausea or vomiting and Pain level controlled  Post-op Vital Signs: Reviewed and stable  Complications: No apparent anesthesia complications

## 2012-02-21 NOTE — OR Nursing (Signed)
Pt states allergy to Shellfish, however, no reactions to Topical Betadine/Iodine. Needle electrodes placed for Nuvasive STIM monitoring by A. Christell Constant, RN. Removed at end of case.

## 2012-02-21 NOTE — Anesthesia Preprocedure Evaluation (Addendum)
Anesthesia Evaluation  Patient identified by MRN, date of birth, ID band Patient awake    Reviewed: Allergy & Precautions, H&P , NPO status , Patient's Chart, lab work & pertinent test results  Airway Mallampati: I TM Distance: >3 FB Neck ROM: Full    Dental  (+) Teeth Intact and Dental Advisory Given   Pulmonary  breath sounds clear to auscultation        Cardiovascular Rhythm:Regular Rate:Normal     Neuro/Psych Depression    GI/Hepatic   Endo/Other  Diabetes mellitus-, Well Controlled, Type 2, Oral Hypoglycemic AgentsMorbid obesity  Renal/GU      Musculoskeletal   Abdominal   Peds  Hematology   Anesthesia Other Findings   Reproductive/Obstetrics                          Anesthesia Physical Anesthesia Plan  ASA: III  Anesthesia Plan: General   Post-op Pain Management:    Induction: Intravenous  Airway Management Planned: Oral ETT  Additional Equipment:   Intra-op Plan:   Post-operative Plan: Extubation in OR and Possible Post-op intubation/ventilation  Informed Consent: I have reviewed the patients History and Physical, chart, labs and discussed the procedure including the risks, benefits and alternatives for the proposed anesthesia with the patient or authorized representative who has indicated his/her understanding and acceptance.   Dental advisory given  Plan Discussed with: CRNA, Surgeon and Anesthesiologist  Anesthesia Plan Comments:        Anesthesia Quick Evaluation

## 2012-02-22 LAB — GLUCOSE, CAPILLARY
Glucose-Capillary: 149 mg/dL — ABNORMAL HIGH (ref 70–99)
Glucose-Capillary: 124 mg/dL — ABNORMAL HIGH (ref 70–99)

## 2012-02-22 MED ORDER — HYDROMORPHONE HCL PF 1 MG/ML IJ SOLN
1.0000 mg | INTRAMUSCULAR | Status: DC | PRN
  Administered 2012-02-22 – 2012-02-24 (×7): 1 mg via INTRAVENOUS
  Filled 2012-02-22 (×7): qty 1

## 2012-02-22 NOTE — Progress Notes (Signed)
Patient ID: Valerie Diaz, female   DOB: [birth year], 46 y.o.   MRN: [id number] C/o incisional pain, no weakness. Requesting iv analgesics.pt/ot to see.

## 2012-02-23 ENCOUNTER — Encounter (HOSPITAL_COMMUNITY): Payer: Self-pay | Admitting: *Deleted

## 2012-02-23 LAB — GLUCOSE, CAPILLARY
Glucose-Capillary: 92 mg/dL (ref 70–99)
Glucose-Capillary: 195 mg/dL — ABNORMAL HIGH (ref 70–99)

## 2012-02-23 NOTE — Progress Notes (Signed)
Filed Vitals:   02/22/12 2016 02/23/12 0258 02/23/12 0542 02/23/12 0900  BP: 98/63 100/68 110/70 110/69  Pulse: 119 100 98 119  Temp: 98.7 F (37.1 C) 98.6 F (37 C) 98.8 F (37.1 C) 99.8 F (37.7 C)  TempSrc: Oral Oral Oral Oral  Resp: 18 18 18 17   Height:      Weight:      SpO2: 91% 92% 96% 97%    Patient sitting up for lunch. Limited mobility and ambulation, has not yet inflated halls. We'll order PT and OT, and encourage patient to work hard on progression..  Plan: Bernestine Amass, MD 02/23/2012, 12:52 PM

## 2012-02-23 NOTE — Progress Notes (Signed)
PT PROGRESS NOTE  MD order received for PT eval.  Pt is declining participation this PM.  She reports just receiving IV pain meds and being too groggy.  Husband states "if you try to walk her now, you will have to carry her."  She does report ambulating with RW and nsg assist in hallway.  Pt and husband have concerns RE: d/c home due to bedroom and bathroom being upstairs in their townhouse.  They would like to explore the option of ST SNF.  PT unable to determine appropriate d/c option without mobility assess.  With pt age, prior mobility status, and family availability at home for assist,  PT is led to believe home with HHPT is feasible.  PT to f/u tomorrow AM.  Aida Raider, PT  Office # [phone] Pager [phone]

## 2012-02-24 LAB — GLUCOSE, CAPILLARY
Glucose-Capillary: 160 mg/dL — ABNORMAL HIGH (ref 70–99)
Glucose-Capillary: 115 mg/dL — ABNORMAL HIGH (ref 70–99)

## 2012-02-24 MED ORDER — HYDROMORPHONE HCL 2 MG PO TABS
4.0000 mg | ORAL_TABLET | ORAL | Status: DC | PRN
  Administered 2012-02-24 – 2012-02-26 (×11): 4 mg via ORAL
  Filled 2012-02-24 (×4): qty 2
  Filled 2012-02-24: qty 1
  Filled 2012-02-24 (×7): qty 2

## 2012-02-24 MED ORDER — DEXAMETHASONE 6 MG PO TABS
6.0000 mg | ORAL_TABLET | Freq: Three times a day (TID) | ORAL | Status: AC
  Administered 2012-02-24 – 2012-02-25 (×4): 6 mg via ORAL
  Filled 2012-02-24 (×4): qty 1

## 2012-02-24 MED FILL — Insulin Aspart Inj 100 Unit/ML: SUBCUTANEOUS | Qty: 0.04 | Status: AC

## 2012-02-24 MED FILL — Insulin Aspart Inj 100 Unit/ML: SUBCUTANEOUS | Qty: 0.03 | Status: AC

## 2012-02-24 MED FILL — Insulin Aspart Inj 100 Unit/ML: SUBCUTANEOUS | Qty: 0.06 | Status: AC

## 2012-02-24 NOTE — Progress Notes (Signed)
UR COMPLETED  

## 2012-02-24 NOTE — Evaluation (Signed)
Occupational Therapy Evaluation Patient Details Name: Valerie Diaz MRN: [id number] DOB: Jul 15, 1966 Today's Date: 02/24/2012  Problem List: There is no problem list on file for this patient.   Past Medical History:  Past Medical History  Diagnosis Date  . Diabetes mellitus   . Bulging disc   . Bipolar disorder   . Depression    Past Surgical History:  Past Surgical History  Procedure Date  . Low back surgery     x 3  . Cholecystectomy 1992  . Elbow surgery     fix tendon on left arm    OT Assessment/Plan/Recommendation OT Assessment Clinical Impression Statement: Pt admitted for L3-L4  L4-L5 fixation with resulting difficulty with mobility for ADL and LB ADL.  Will follow acutely. Recommend home with spouse and HHOT.  Will follow acutely. OT Recommendation/Assessment: Patient will need skilled OT in the acute care venue OT Problem List: Decreased activity tolerance;Impaired balance (sitting and/or standing);Decreased knowledge of use of DME or AE;Decreased knowledge of precautions;Obesity;Pain OT Therapy Diagnosis : Generalized weakness;Acute pain OT Plan OT Frequency: Min 2X/week OT Treatment/Interventions: Self-care/ADL training;DME and/or AE instruction;Patient/family education;Balance training OT Recommendation Follow Up Recommendations: Home health OT Equipment Recommended: Rolling walker with 5" wheels Individuals Consulted Consulted and Agree with Results and Recommendations: Patient OT Goals Acute Rehab OT Goals OT Goal Formulation: With patient Time For Goal Achievement: 7 days ADL Goals Pt Will Perform Grooming: with modified independence;Standing at sink ADL Goal: Grooming - Progress: Goal set today Pt Will Perform Lower Body Bathing: with modified independence;Sitting, edge of bed;Sit to stand from bed;with adaptive equipment ADL Goal: Lower Body Bathing - Progress: Goal set today Pt Will Perform Lower Body Dressing: with modified independence;Sitting,  bed;Sit to stand from bed;with adaptive equipment ADL Goal: Lower Body Dressing - Progress: Goal set today Pt Will Perform Toileting - Hygiene: with modified independence;Sitting on 3-in-1 or toilet ADL Goal: Toileting - Hygiene - Progress: Goal set today Pt Will Perform Tub/Shower Transfer: Tub transfer;with min assist;Ambulation;Shower seat with back;Maintaining back safety precautions ADL Goal: Tub/Shower Transfer - Progress: Goal set today Additional ADL Goal #1: Pt will perform bed mobility mod I in prep for ADL at EOB.  OT Evaluation Precautions/Restrictions  Precautions Precautions: Back;Fall Precaution Booklet Issued: Yes (comment) Required Braces or Orthoses: Yes Spinal Brace: Lumbar corset;Applied in sitting position Restrictions Weight Bearing Restrictions: No Prior Functioning Home Living Lives With: Family Receives Help From: Family Type of Home: House Home Layout: Two level;1/2 bath on main level Alternate Level Stairs-Rails: None Alternate Level Stairs-Number of Steps: 18 Home Access: Stairs to enter Entrance Stairs-Rails: None Entrance Stairs-Number of Steps: 3 Bathroom Shower/Tub: Forensic scientist: Standard Bathroom Accessibility: No Home Adaptive Equipment: None Prior Function Level of Independence: Independent with basic ADLs;Independent with gait Able to Take Stairs?: Yes Driving: Yes Vocation: On disability ADL ADL Eating/Feeding: Simulated;Independent Where Assessed - Eating/Feeding: Chair Grooming: Performed;Wash/dry hands;Supervision/safety Where Assessed - Grooming: Standing at sink Upper Body Bathing: Simulated;Set up Where Assessed - Upper Body Bathing: Sitting, bed Lower Body Bathing: Simulated;+1 Total assistance Where Assessed - Lower Body Bathing: Sitting, bed;Sit to stand from bed Upper Body Dressing: Performed;Set up Where Assessed - Upper Body Dressing: Sitting, bed Lower Body Dressing: +1 Total  assistance;Performed Where Assessed - Lower Body Dressing: Sitting, chair Toilet Transfer: Performed;Minimal assistance (min guard assist, cues for technique) Toilet Transfer Method: Ambulating Toilet Transfer Equipment: Regular height toilet;Grab bars Toileting - Clothing Manipulation: Performed;Minimal assistance Where Assessed - Toileting Clothing Manipulation: Standing Toileting -  Hygiene: Performed;Independent Equipment Used: Rolling walker Ambulation Related to ADLs: Ambulated with min guard to supervision and RW. Vision/Perception  Vision - History Baseline Vision: Wears glasses only for reading Patient Visual Report: No change from baseline Cognition Cognition Arousal/Alertness: Awake/alert Overall Cognitive Status: Appears within functional limits for tasks assessed Difficult to assess due to: level of arousal Orientation Level: Oriented X4 Sensation/Coordination Sensation Light Touch: Appears Intact Coordination Gross Motor Movements are Fluid and Coordinated: Yes Fine Motor Movements are Fluid and Coordinated: Yes Extremity Assessment RUE Assessment RUE Assessment: Within Functional Limits LUE Assessment LUE Assessment: Within Functional Limits Mobility  Bed Mobility Bed Mobility: Yes Rolling Right: 4: Min assist;With rail Rolling Right Details (indicate cue type and reason): Patient required min assist for rolling and VC for handplacement. Right Sidelying to Sit: 4: Min assist;With rails Right Sidelying to Sit Details (indicate cue type and reason): Patient required min assist secondary to pain. Sitting - Scoot to Edge of Bed: 6: Modified independent (Device/Increase time) Transfers Transfers: Yes Sit to Stand: 4: Min assist;From elevated surface;From bed;With upper extremity assist;From toilet Sit to Stand Details (indicate cue type and reason): Pt required min assist and VC for hand placement with RW.  In bathroom, patient able to transfer from sitting on the  toilet to standing independently. Stand to Sit: Other (comment);With armrests;With upper extremity assist;To chair/3-in-1;To toilet Stand to Sit Details: Patient requires min guard assist for safety and VC for hand placement with RW. End of Session OT - End of Session Equipment Utilized During Treatment: Gait belt;Back brace Activity Tolerance: Patient tolerated treatment well Patient left: in chair;with call bell in reach;with family/visitor present General Behavior During Session: Flat affect Cognition: WFL for tasks performed   Evern Bio 02/24/2012, 1:20 PM

## 2012-02-24 NOTE — Evaluation (Signed)
Agree with student PT evaluation.  Clinten Howk, PT DPT [phone]  

## 2012-02-24 NOTE — Progress Notes (Signed)
Patient ID: Valerie Diaz, female   DOB: [birth year], 46 y.o.   MRN: [id number] Subjective: Patient reports Lots of pain  Objective: Vital signs in last 24 hours: Temp:  [97.9 F (36.6 C)-99.9 F (37.7 C)] 97.9 F (36.6 C) (03/18 1025) Pulse Rate:  [100-118] 114  (03/18 1025) Resp:  [17-20] 18  (03/18 1025) BP: (105-121)/(65-78) 121/65 mmHg (03/18 1025) SpO2:  [91 %-96 %] 93 % (03/18 1025)  Intake/Output from previous day: 03/17 0701 - 03/18 0700 In: 600 [P.O.:600] Out: -  Intake/Output this shift:    Awake, alert oriented. Wound looks excellent. Neuro stable  Lab Results: No results found for this basename: WBC:2,HGB:2,HCT:2,PLT:2 in the last 72 hours BMET No results found for this basename: NA:2,K:2,CL:2,CO2:2,GLUCOSE:2,BUN:2,CREATININE:2,CALCIUM:2 in the last 72 hours  Studies/Results: No results found.  Assessment/Plan: Stable. Feels that she needs a SNF due to her home lay out. Will consult CSW.  LOS: 3 days  As above   Reinaldo Meeker, MD 02/24/2012, 1:16 PM

## 2012-02-24 NOTE — Evaluation (Signed)
Physical Therapy Evaluation Patient Details Name: Valerie Diaz MRN: [id number] DOB: 02/22/1966 Today's Date: 02/24/2012  Problem List: There is no problem list on file for this patient.   Past Medical History:  Past Medical History  Diagnosis Date  . Diabetes mellitus   . Bulging disc   . Bipolar disorder   . Depression    Past Surgical History:  Past Surgical History  Procedure Date  . Low back surgery     x 3  . Cholecystectomy 1992  . Elbow surgery     fix tendon on left arm    PT Assessment/Plan/Recommendation PT Assessment Clinical Impression Statement: Pt is a 46 y.o. female s/p L3-L4, L4-L5 fixation with resulting pain, impaired gait, and impaired balance. PT Recommendation/Assessment: Patient will need skilled PT in the acute care venue PT Problem List: Decreased strength;Decreased activity tolerance;Decreased balance;Decreased mobility;Decreased coordination;Decreased knowledge of use of DME;Decreased safety awareness;Decreased knowledge of precautions;Pain Barriers to Discharge: Inaccessible home environment PT Therapy Diagnosis : Difficulty walking;Abnormality of gait;Generalized weakness;Acute pain PT Plan PT Frequency: Min 5X/week PT Treatment/Interventions: DME instruction;Gait training;Stair training;Functional mobility training;Therapeutic activities;Balance training;Neuromuscular re-education;Patient/family education PT Recommendation Follow Up Recommendations: Home health PT Equipment Recommended: Rolling walker with 5" wheels PT Goals  Acute Rehab PT Goals PT Goal Formulation: With patient/family Time For Goal Achievement: 7 days Pt will Roll Supine to Right Side: Independently;with rail PT Goal: Rolling Supine to Right Side - Progress: Goal set today Pt will go Supine/Side to Sit: Independently;with rail PT Goal: Supine/Side to Sit - Progress: Goal set today Pt will go Sit to Stand: with modified independence;from elevated surface;with upper  extremity assist PT Goal: Sit to Stand - Progress: Goal set today Pt will go Stand to Sit: with supervision;with upper extremity assist PT Goal: Stand to Sit - Progress: Goal set today Pt will Ambulate: >150 feet;with supervision;with rolling walker PT Goal: Ambulate - Progress: Goal set today Pt will Go Up / Down Stairs: Flight;with supervision PT Goal: Up/Down Stairs - Progress: Goal set today Additional Goals Additional Goal #1: Patient will be able to describe and demonstrate 3/3 back precautions. PT Goal: Additional Goal #1 - Progress: Goal set today  PT Evaluation Precautions/Restrictions  Precautions Precautions: Back Precaution Booklet Issued: Yes (comment) Required Braces or Orthoses: Yes Spinal Brace: Lumbar corset;Applied in sitting position Restrictions Weight Bearing Restrictions: No Prior Functioning  Home Living Lives With: Family Receives Help From: Family Type of Home: House Home Layout: Two level;1/2 bath on main level Alternate Level Stairs-Rails: None Alternate Level Stairs-Number of Steps: 18 Home Access: Stairs to enter Entrance Stairs-Rails: None Entrance Stairs-Number of Steps: 3 Bathroom Shower/Tub: Forensic scientist: Standard Bathroom Accessibility: No Prior Function Level of Independence: Independent with basic ADLs;Independent with gait Able to Take Stairs?: Yes Driving: Yes Vocation: On disability Cognition Cognition Arousal/Alertness: Lethargic Overall Cognitive Status: Difficult to assess Difficult to assess due to: level of arousal Orientation Level: Oriented X4 Sensation/Coordination Sensation Light Touch: Appears Intact Coordination Gross Motor Movements are Fluid and Coordinated: Yes Extremity Assessment RLE Assessment RLE Assessment: Exceptions to [facility] RLE Strength RLE Overall Strength: Deficits RLE Overall Strength Comments: General RLE weakness. LLE Assessment LLE Assessment: Exceptions to [facility] LLE  Strength LLE Overall Strength: Deficits LLE Overall Strength Comments: General LLE weakness. Mobility (including Balance) Bed Mobility Bed Mobility: Yes Rolling Right: 4: Min assist;With rail Rolling Right Details (indicate cue type and reason): Patient required min assist for rolling and VC for handplacement. Right Sidelying to Sit: 4: Min assist;With rails  Right Sidelying to Sit Details (indicate cue type and reason): Patient required min assist secondary to pain. Sitting - Scoot to Edge of Bed: 6: Modified independent (Device/Increase time) Transfers Transfers: Yes Sit to Stand: 4: Min assist;From elevated surface;From bed;With upper extremity assist;From toilet Sit to Stand Details (indicate cue type and reason): Pt required min assist and VC for hand placement with RW.  In bathroom, patient able to transfer from sitting on the toilet to standing independently. Stand to Sit: Other (comment);With armrests;With upper extremity assist;To chair/3-in-1;To toilet (Min guard) Stand to Sit Details: Patient requires min guard assist for safety and VC for hand placement with RW. Ambulation/Gait Ambulation/Gait: Yes Ambulation/Gait Assistance: 4: Min assist Ambulation/Gait Assistance Details (indicate cue type and reason): Patient required min assist for safety Ambulation Distance (Feet): 50 Feet Assistive device: Rolling walker Gait Pattern: Step-to pattern;Decreased stride length;Shuffle Gait velocity: Decreased Stairs: No  Balance Balance Assessed: Yes Static Standing Balance Static Standing - Balance Support: During functional activity;No upper extremity supported Static Standing - Level of Assistance: 5: Stand by assistance Static Standing - Comment/# of Minutes: 5 minutes Exercise    End of Session PT - End of Session Equipment Utilized During Treatment: Gait belt;Back brace Activity Tolerance: Patient limited by pain Patient left: in chair;with call bell in reach;with  family/visitor present Nurse Communication: Other (comment) (Pain level) General Behavior During Session: Flat affect Cognition: WFL for tasks performed  Ezzard Standing SPT 02/24/2012, 1:01 PM

## 2012-02-25 LAB — GLUCOSE, CAPILLARY
Glucose-Capillary: 148 mg/dL — ABNORMAL HIGH (ref 70–99)
Glucose-Capillary: 175 mg/dL — ABNORMAL HIGH (ref 70–99)

## 2012-02-25 LAB — POCT I-STAT 4, (NA,K, GLUC, HGB,HCT)
HCT: 31 % — ABNORMAL LOW (ref 36.0–46.0)
Sodium: 138 mEq/L (ref 135–145)

## 2012-02-25 NOTE — Progress Notes (Signed)
Patient ID: Valerie Diaz, female   DOB: [birth year], 46 y.o.   MRN: [id number] Patient reports feeling much better. She is increasing her activity with the help of PT/OT. Her wounds all look good. She has decided to go home rather than to a SNF, so we will need to determine her home health needs and get these arranged for hopefully d/c tomorrow.

## 2012-02-25 NOTE — Progress Notes (Signed)
CARE MANAGEMENT NOTE 02/25/2012     Action/Plan:   Discharge planning. Spoke with patient and husband.Choice offered for Home Health. Discussed DME needs. Entered in TLC.   Anticipated DC Date:  02/26/2012   Anticipated DC Plan:  HOME W HOME HEALTH SERVICES      DC Planning Services  CM consult      PAC Choice  DURABLE MEDICAL EQUIPMENT  HOME HEALTH   Choice offered to / List presented to:  C-1 Patient   DME arranged  [facility] BED  ELEVATED COMMODE SEAT      DME agency  [facility]     HH arranged  HH-2 PT      [facility] agency  [facility]   Status of service:  Completed, signed off  Discharge Disposition:  HOME W HOME HEALTH SERVICES

## 2012-02-25 NOTE — Progress Notes (Signed)
Agree with student PT treatment note. Venora Kautzman, PT DPT [phone]  

## 2012-02-25 NOTE — Progress Notes (Signed)
Physical Therapy Treatment Patient Details Name: Valerie Diaz MRN: [id number] DOB: [birth year] Today's Date: 02/25/2012  PT Assessment/Plan  PT - Assessment/Plan Comments on Treatment Session: Pt was up and out of bed with OT when PT entered.  Patient appeared much more alert and involved in treatment throughout the session.  Patient comminucated that she now plans to go home and put a bed on the first floor.  Patient ambulated and ascended and descended the stairs with supervision and min assist respectively. PT Plan: Discharge plan remains appropriate PT Frequency: Min 5X/week Follow Up Recommendations: Home health PT Equipment Recommended: Rolling walker with 5" wheels PT Goals  Acute Rehab PT Goals PT Goal Formulation: With patient Time For Goal Achievement: 7 days Pt will go Stand to Sit: with supervision;with upper extremity assist PT Goal: Stand to Sit - Progress: Progressing toward goal Pt will Ambulate: >150 feet;with supervision;with rolling walker PT Goal: Ambulate - Progress: Progressing toward goal Pt will Go Up / Down Stairs: Flight;with supervision PT Goal: Up/Down Stairs - Progress: Progressing toward goal  PT Treatment Precautions/Restrictions  Precautions Precautions: Back;Fall Precaution Booklet Issued: Yes (comment) Required Braces or Orthoses: Yes Spinal Brace: Lumbar corset;Applied in sitting position Restrictions Weight Bearing Restrictions: No Mobility (including Balance) Bed Mobility Bed Mobility: No Transfers Transfers: Yes Stand to Sit: Other (comment);With armrests;With upper extremity assist;To chair/3-in-1 (Min guard) Stand to Sit Details: Patient required min guard assist fro safety and to control descent into chair. Ambulation/Gait Ambulation/Gait: Yes Ambulation/Gait Assistance: Other (comment) (Min guard) Ambulation/Gait Assistance Details (indicate cue type and reason): Patient required min guard for safety. Ambulation Distance (Feet):  250 Feet Assistive device: Rolling walker Gait Pattern: Step-through pattern;Decreased stride length;Shuffle Gait velocity: Decreased Stairs: Yes Stairs Assistance: 4: Min assist Stairs Assistance Details (indicate cue type and reason): Patient required VC for the proper sequencing pattern to ascend and descend the steps.  Patient required min guard assist to ascend and descend 2 steps when using both rails.  Patient required hand held min assist to ascend and descend to steps without using the rail. Stair Management Technique: Two rails;No rails;Forwards;Step to pattern Number of Stairs: 2  Wheelchair Mobility Wheelchair Mobility: No  Posture/Postural Control Posture/Postural Control: No significant limitations Balance Balance Assessed: No Exercise    End of Session PT - End of Session Equipment Utilized During Treatment: Gait belt;Back brace Activity Tolerance: Patient tolerated treatment well Patient left: in chair;with call bell in reach General Behavior During Session: [facility] for tasks performed Cognition: [facility] for tasks performed  Ezzard Standing SPT 02/25/2012, 2:23 PM

## 2012-02-25 NOTE — Progress Notes (Signed)
Occupational Therapy Treatment Patient Details Name: Valerie Diaz MRN: [id number] DOB: [birth year] Today's Date: 02/25/2012  OT Assessment/Plan OT Assessment/Plan Comments on Treatment Session: Pt. demonstrates improved functional mobility and improved independence with BADLs today. Pt. will need AE for LB ADLs.  Continues to need cues for back precautions.  Pt. now plans to discharge home, with bed in living room on first floor, and 1/2 bath OT Plan: Discharge plan remains appropriate OT Frequency: Min 2X/week Follow Up Recommendations: Home health OT Equipment Recommended: Rolling walker with 5" wheels;3 in 1 bedside comode OT Goals ADL Goals ADL Goal: Grooming - Progress: Progressing toward goals ADL Goal: Lower Body Bathing - Progress: Progressing toward goals ADL Goal: Lower Body Dressing - Progress: Progressing toward goals ADL Goal: Tub/Shower Transfer - Progress: Discontinued (comment) (pt. will be on first floor with 1/2 bath)  OT Treatment Precautions/Restrictions  Precautions Precautions: Back;Fall Precaution Booklet Issued: Yes (comment) Precaution Comments: Pt. able to recall only 1/3 precautions Required Braces or Orthoses: Yes Spinal Brace: Lumbar corset;Applied in sitting position Restrictions Weight Bearing Restrictions: No   ADL ADL Grooming: Performed;Teeth care;Brushing hair;Supervision/safety Lower Body Bathing: Performed;Supervision/safety Lower Body Bathing Details (indicate cue type and reason): pt. instructed in use of AE Where Assessed - Lower Body Bathing: Sit to stand from chair;Sit to stand from bed Lower Body Dressing: Performed;Supervision/safety Lower Body Dressing Details (indicate cue type and reason): Pt. instructed in use of AE for LB ADLs Where Assessed - Lower Body Dressing: Sit to stand from bed Toilet Transfer: Simulated;Supervision/safety Toilet Transfer Method: Proofreader: Regular height toilet;Grab  bars Equipment Used: Rolling walker Ambulation Related to ADLs: pt. ambulated with supervision ADL Comments: Pt. required min A and mod verbal cues to don back brace.  Pt. instructed in use of AE for LB ADLs Mobility  Bed Mobility Bed Mobility: Yes Rolling Right: 5: Supervision;With rail Right Sidelying to Sit: 5: Supervision;HOB flat;With rails Sitting - Scoot to Edge of Bed: 6: Modified independent (Device/Increase time) Transfers Transfers: Yes Sit to Stand: 5: Supervision;With upper extremity assist;From bed Exercises    End of Session OT - End of Session Activity Tolerance: Patient tolerated treatment well Patient left:  (with PT) Nurse Communication: Mobility status for ambulation General Behavior During Session: [facility] for tasks performed Cognition: [facility] for tasks performed  Annarose Ouellet M  02/25/2012, 6:57 PM

## 2012-02-25 NOTE — Discharge Instructions (Signed)
Home Health will be provided by [facility]-[phone] 

## 2012-02-25 NOTE — Progress Notes (Signed)
CSW received a consult for "SNF." PT is recommending home health services. RNCM is aware and will be following. CSW is signing off as no further social work discharge needs identified. Please reconsult if a need arises prior to discharge.   Dede Query, MSW, Theresia Majors [phone]

## 2012-02-26 MED ORDER — HYDROMORPHONE HCL 4 MG PO TABS: 4.0000 mg | ORAL_TABLET | ORAL | Status: DC | PRN

## 2012-02-26 NOTE — Progress Notes (Signed)
Physical Therapy Treatment Patient Details Name: Valerie Diaz MRN: [id number] DOB: 06/11/1966 Today's Date: 02/26/2012  PT Assessment/Plan  PT - Assessment/Plan Comments on Treatment Session: Pt. up and alert in chair this morning. Willing to participate in PT. Plans to d/c today. Husband present in room at tx time. Pt. ambulated to and from 3000 gym to practice stairs, states she feels confident with them. She was fatigued after ambulating ~200 ft and stated her legs and back were beginning to feel "wobbly". Hospital bed has been recieved at patient's home but is awaiting a repair. Pt. had concerns about getting into car upon d/c today, educated patient and husband on proper technique with back precuations.   PT Plan: Discharge plan remains appropriate PT Frequency: Min 5X/week Follow Up Recommendations: Home health PT Equipment Recommended: Rolling walker with 5" wheels;3 in 1 bedside comode PT Goals  Acute Rehab PT Goals PT Goal Formulation: With patient Time For Goal Achievement: 7 days PT Goal: Sit to Stand - Progress: Progressing toward goal PT Goal: Stand to Sit - Progress: Progressing toward goal PT Goal: Ambulate - Progress: Progressing toward goal PT Goal: Up/Down Stairs - Progress: Not met  PT Treatment Precautions/Restrictions  Precautions Precautions: Back;Fall Precaution Booklet Issued: Yes (comment) Required Braces or Orthoses: Yes Spinal Brace: Lumbar corset;Applied in sitting position Mobility (including Balance) Transfers Transfers: Yes Sit to Stand: 5: Supervision;With upper extremity assist;With armrests;From chair/3-in-1 Sit to Stand Details (indicate cue type and reason): demonstrated proper technique  Stand to Sit: With upper extremity assist;With armrests;To chair/3-in-1 (min guard (A)) Stand to Sit Details: Min guard (A) for safety and patient stating her legs were beginning to feel wobbly. Ambulation/Gait Ambulation/Gait: Yes Ambulation/Gait  Assistance: 5: Supervision Ambulation/Gait Assistance Details (indicate cue type and reason): pt. ambulated to and from 3000 gym with RW. Patient performed gait challenges when walking back to room such as looking to left/right and stopping and turning. Pt. stated she felt unstable with head turning, educated pt. on stopping and then looking for increased stability.   Ambulation Distance (Feet): 200 Feet Assistive device: Rolling walker Gait Pattern: Step-through pattern;Decreased stride length Gait velocity: Decreased Stairs: Yes Stairs Assistance: 4: Min assist Stairs Assistance Details (indicate cue type and reason): HHA assist with ascend/descend 2 steps, used wall on right side for stability & HHA on left provided.   cues for technique and sequence. Also practiced on platform step to simulate step on walkway at home, verbal cues for technique/sequence. Pt states she feels good about stairs to get into house this afternoon. Stair Management Technique: One rail Right;No rails;Forwards;With walker;Other (comment) (HHA) Number of Stairs: 2   End of Session PT - End of Session Equipment Utilized During Treatment: Gait belt;Back brace Activity Tolerance: Patient tolerated treatment well;Patient limited by fatigue Patient left: in chair;with call bell in reach;with family/visitor present General Behavior During Session: [facility] for tasks performed Cognition: [facility] for tasks performed  Ardyth Gal SPTA 02/26/2012, 12:52 PM     Verdell Face, PTA [phone] 02/26/2012

## 2012-02-26 NOTE — Progress Notes (Signed)
Occupational Therapy Treatment Patient Details Name: Valerie Diaz MRN: [id number] DOB: 02-14-1966 Today's Date: 02/26/2012 10:50-11:11 [facility] OT Assessment/Plan OT Assessment/Plan Comments on Treatment Session: Pt making excellent progress with OT.  Overall at a modified independent level for selfcare tasks and functional transfers.  Issued AE for LB selfcare.  Will also need 3:1 for home, which is supposed to be delivered.  Feel pt has met all OT goals.  No further OT needs.  Feel pt doe not have any HHOT needs. OT Plan: Discharge plan needs to be updated Follow Up Recommendations: No OT follow up Equipment Recommended: 3 in 1 bedside commode OT Goals ADL Goals ADL Goal: Grooming - Progress: Met ADL Goal: Lower Body Bathing - Progress: Met ADL Goal: Lower Body Dressing - Progress: Met ADL Goal: Toileting - Hygiene - Progress: Met ADL Goal: Tub/Shower Transfer - Progress: Discontinued (comment)  OT Treatment Precautions/Restrictions  Precautions Precautions: Back Precaution Booklet Issued: Yes (comment) Precaution Comments: Pt able to recall 2/3 back precautions. Required Braces or Orthoses: No Spinal Brace: Lumbar corset;Applied in sitting position Restrictions Weight Bearing Restrictions: No   ADL ADL Upper Body Dressing: Modified independent (Pt able to donn lumbar corsett.) Where Assessed - Upper Body Dressing: Sitting, bed;Unsupported Lower Body Dressing: Performed;Modified independent Lower Body Dressing Details (indicate cue type and reason): Pt issued AE, able to utilize reacher and sockaide with min instructional cueing to remove and donn gripper socks. Where Assessed - Lower Body Dressing: Sit to stand from bed Toilet Transfer: Performed;Modified independent Toilet Transfer Method: Proofreader: Raised toilet seat with arms (or 3-in-1 over toilet) Toileting - Clothing Manipulation: Performed;Modified independent Where Assessed - Toileting  Clothing Manipulation: Sit to stand from 3-in-1 or toilet Toileting - Hygiene: Performed;Modified independent Where Assessed - Toileting Hygiene: Sit to stand from 3-in-1 or toilet Ambulation Related to ADLs: Pt modified independent with use of the RW for mobility to and from the bathroom. ADL Comments: Pt able to use AE efficiently for LB selfcare. Mobility  Bed Mobility Bed Mobility: Yes Rolling Right: 6: Modified independent (Device/Increase time);With rail Right Sidelying to Sit: 6: Modified independent (Device/Increase time);With rails;HOB flat Transfers Transfers: Yes Sit to Stand: 6: Modified independent (Device/Increase time);From bed;With upper extremity assist Sit to Stand Details (indicate cue type and reason): demonstrated proper technique  Stand to Sit: 6: Modified independent (Device/Increase time);To bed Stand to Sit Details: Min guard (A) for safety and patient stating her legs were beginning to feel wobbly.  End of Session OT - End of Session Equipment Utilized During Treatment: Back brace Activity Tolerance: Patient tolerated treatment well Patient left: in chair;with call bell in reach;with family/visitor present General Behavior During Session: [facility] for tasks performed Cognition: [facility] for tasks performed  Ashawna Hanback OTR/L 02/26/2012, 1:53 PM Pager number [phone]

## 2012-02-26 NOTE — Discharge Summary (Signed)
Physician Discharge Summary  Patient ID: Valerie Diaz MRN: [id number] DOB/AGE: [birth year] 46 y.o.  Admit date: 02/21/2012 Discharge date: 02/26/2012  Admission Diagnoses:  Discharge Diagnoses:  Active Problems:  * No active hospital problems. *    Discharged Condition: good  Hospital Course: Surgery 5 days ago with 2 level TLIF. Did well. Lots of post op pain but it began to improve around day 4. By day 5, up ambulating well, tolerating regular diet. Wound healing well. Discharged home, specific instructions given. Markedly improved vs pre op.  Consults: None  Significant Diagnostic Studies: none  Treatments: L34 L45 tlif  Discharge Exam: Blood pressure 107/70, pulse 72, temperature 97.7 F (36.5 C), temperature source Oral, resp. rate 18, height 5\' 5"  (1.651 m), weight 111.585 kg (246 lb), last menstrual period 02/19/2012, SpO2 96.00%. Incision/Wound:Healing well.  Disposition: 01-Home or Self Care  Discharge Orders    Future Orders Please Complete By Expires   DME Bedside commode      DME Hospital bed      Diet general      Discharge instructions      Comments:   Mostly bedrest. Get up 9 or 10 times each day and walk for 15-20 minutes each time. Very little sitting the first week. No riding in the car until your first post op appointment. If you had neck surgery...may shower from the chest down. If you had low back surgery....you may shower with a saran wrap covering over the incision. Take your pain medicine as needed...and other medicines that you are instructed to take. Call for an appointment...[phone].   Call MD for:  temperature >100.4      Call MD for:  persistant nausea and vomiting      Call MD for:  severe uncontrolled pain      Call MD for:  redness, tenderness, or signs of infection (pain, swelling, redness, odor or green/yellow discharge around incision site)      Call MD for:  difficulty breathing, headache or visual disturbances      Call MD for:   hives        Medication List  As of 02/26/2012 12:25 PM   STOP taking these medications         HYDROcodone-acetaminophen 5-325 MG per tablet         TAKE these medications         desvenlafaxine 50 MG 24 hr tablet   Commonly known as: PRISTIQ   Take 50 mg by mouth daily.      divalproex 500 MG DR tablet   Commonly known as: DEPAKOTE   Take 1,500 mg by mouth at bedtime.      glipiZIDE 5 MG tablet   Commonly known as: GLUCOTROL   Take 5 mg by mouth daily.      HYDROmorphone 4 MG tablet   Commonly known as: DILAUDID   Take 1 tablet (4 mg total) by mouth every 3 (three) hours as needed.      leuprolide 3.75 MG injection   Commonly known as: LUPRON   Inject 3.75 mg into the muscle every 28 (twenty-eight) days.      metFORMIN 1000 MG tablet   Commonly known as: GLUCOPHAGE   Take 1,000 mg by mouth 2 (two) times daily with a meal.             At home rest most of the time. Get up 9 or 10 times each day and take a 15 or 20 minute walk.  No riding in the car and to your first postoperative appointment. If you have neck surgery you may shower from the chest down starting on the third postoperative day. If you had back surgery he may start showering on the third postoperative day with saran wrap wrapped around your incisional area 3 times. After the shower remove the saran wrap. Take pain medicine as needed and other medications as instructed. Call my office for an appointment.  SignedReinaldo Meeker, MD 02/26/2012, 12:25 PM

## 2012-03-08 ENCOUNTER — Encounter (HOSPITAL_COMMUNITY): Payer: Self-pay

## 2012-03-08 ENCOUNTER — Emergency Department (HOSPITAL_COMMUNITY)
Admission: EM | Admit: 2012-03-08 | Discharge: 2012-03-08 | Disposition: A | Discharge status: Home / Self Care | Payer: 59 | Attending: Emergency Medicine | Admitting: Emergency Medicine

## 2012-03-08 DIAGNOSIS — M545 Low back pain, unspecified: Secondary | ICD-10-CM | POA: Insufficient documentation

## 2012-03-08 DIAGNOSIS — M2550 Pain in unspecified joint: Secondary | ICD-10-CM | POA: Insufficient documentation

## 2012-03-08 DIAGNOSIS — E119 Type 2 diabetes mellitus without complications: Secondary | ICD-10-CM | POA: Insufficient documentation

## 2012-03-08 DIAGNOSIS — Z79899 Other long term (current) drug therapy: Secondary | ICD-10-CM | POA: Insufficient documentation

## 2012-03-08 DIAGNOSIS — F3130 Bipolar disorder, current episode depressed, mild or moderate severity, unspecified: Secondary | ICD-10-CM | POA: Insufficient documentation

## 2012-03-08 DIAGNOSIS — Z9889 Other specified postprocedural states: Secondary | ICD-10-CM | POA: Insufficient documentation

## 2012-03-08 DIAGNOSIS — A088 Other specified intestinal infections: Secondary | ICD-10-CM | POA: Insufficient documentation

## 2012-03-08 DIAGNOSIS — G8918 Other acute postprocedural pain: Secondary | ICD-10-CM

## 2012-03-08 DIAGNOSIS — K117 Disturbances of salivary secretion: Secondary | ICD-10-CM | POA: Insufficient documentation

## 2012-03-08 DIAGNOSIS — R1084 Generalized abdominal pain: Secondary | ICD-10-CM | POA: Insufficient documentation

## 2012-03-08 DIAGNOSIS — R630 Anorexia: Secondary | ICD-10-CM | POA: Insufficient documentation

## 2012-03-08 DIAGNOSIS — R112 Nausea with vomiting, unspecified: Secondary | ICD-10-CM | POA: Insufficient documentation

## 2012-03-08 DIAGNOSIS — R197 Diarrhea, unspecified: Secondary | ICD-10-CM | POA: Insufficient documentation

## 2012-03-08 DIAGNOSIS — A084 Viral intestinal infection, unspecified: Secondary | ICD-10-CM

## 2012-03-08 LAB — COMPREHENSIVE METABOLIC PANEL
BUN: 7 mg/dL (ref 6–23)
CO2: 24 mEq/L (ref 19–32)
Chloride: 105 mEq/L (ref 96–112)
Creatinine, Ser: 0.56 mg/dL (ref 0.50–1.10)
GFR calc non Af Amer: >90 mL/min (ref >90)
Glucose, Bld: 164 mg/dL — ABNORMAL HIGH (ref 70–99)
Total Bilirubin: 0.3 mg/dL (ref 0.3–1.2)

## 2012-03-08 LAB — URINALYSIS, ROUTINE W REFLEX MICROSCOPIC
Ketones, ur: NEGATIVE mg/dL
Leukocytes, UA: NEGATIVE
Nitrite: NEGATIVE
Specific Gravity, Urine: 1.003 — ABNORMAL LOW (ref 1.005–1.030)
Urobilinogen, UA: 0.2 mg/dL (ref 0.0–1.0)
pH: 7 (ref 5.0–8.0)

## 2012-03-08 LAB — DIFFERENTIAL
Basophils Absolute: 0 10*3/uL (ref 0.0–0.1)
Basophils Relative: 0 % (ref 0–1)
Eosinophils Relative: 0 % (ref 0–5)
Lymphocytes Relative: 17 % (ref 12–46)
Neutro Abs: 5.9 10*3/uL (ref 1.7–7.7)

## 2012-03-08 LAB — CBC
MCHC: 32.8 g/dL (ref 30.0–36.0)
Platelets: 301 10*3/uL (ref 150–400)
RDW: 13.9 % (ref 11.5–15.5)
WBC: 8.1 10*3/uL (ref 4.0–10.5)

## 2012-03-08 LAB — LIPASE, BLOOD: Lipase: 44 U/L (ref 11–59)

## 2012-03-08 MED ORDER — ONDANSETRON HCL 8 MG PO TABS: 8.0000 mg | ORAL_TABLET | Freq: Three times a day (TID) | ORAL | Status: AC | PRN

## 2012-03-08 MED ORDER — HYDROMORPHONE HCL PF 1 MG/ML IJ SOLN
1.0000 mg | Freq: Once | INTRAMUSCULAR | Status: AC
  Administered 2012-03-08: 1 mg via INTRAVENOUS
  Filled 2012-03-08: qty 1

## 2012-03-08 MED ORDER — HYDROMORPHONE HCL 4 MG PO TABS: 2.0000 mg | ORAL_TABLET | Freq: Four times a day (QID) | ORAL | Status: AC | PRN

## 2012-03-08 MED ORDER — SODIUM CHLORIDE 0.9 % IV BOLUS (SEPSIS)
1000.0000 mL | Freq: Once | INTRAVENOUS | Status: AC
  Administered 2012-03-08: 1000 mL via INTRAVENOUS

## 2012-03-08 MED ORDER — SODIUM CHLORIDE 0.9 % IV SOLN: INTRAVENOUS | Status: DC

## 2012-03-08 MED ORDER — FAMOTIDINE IN NACL 20-0.9 MG/50ML-% IV SOLN
20.0000 mg | Freq: Once | INTRAVENOUS | Status: AC
  Administered 2012-03-08: 20 mg via INTRAVENOUS
  Filled 2012-03-08: qty 50

## 2012-03-08 MED ORDER — ONDANSETRON HCL 4 MG/2ML IJ SOLN
4.0000 mg | Freq: Once | INTRAMUSCULAR | Status: AC
  Administered 2012-03-08: 4 mg via INTRAVENOUS
  Filled 2012-03-08: qty 2

## 2012-03-08 MED ORDER — FAMOTIDINE 20 MG PO TABS: 20.0000 mg | ORAL_TABLET | Freq: Two times a day (BID) | ORAL | Status: DC | PRN

## 2012-03-08 NOTE — ED Notes (Signed)
Pt states that she had back surgery on 02-21-12 and then developed a post op infection in her incision. She was seen by dr. Gerlene Fee and told to wash out the wound with peroxide. She states that over the past couple of days she has developed nausea, vomiting, and diarrhea. She states that she has been unable to keep down any po intake. Pt states that she has also been having the chills and sweats at times. No meds pta. Alert and oriented.

## 2012-03-08 NOTE — ED Provider Notes (Signed)
History     CSN: [id number]  Arrival date & time 03/08/12  1444   First MD Initiated Contact with Patient 03/08/12 1513      Chief Complaint  Patient presents with  . Emesis  . Diarrhea    (Consider location/radiation/quality/duration/timing/severity/associated sxs/prior treatment) Patient is a 46 y.o. female presenting with vomiting and diarrhea. The history is provided by the patient, the spouse and medical records.  Emesis  This is a new problem. The current episode started 2 days ago. The problem occurs 5 to 10 times per day. The problem has not changed since onset.The emesis has an appearance of stomach contents. There has been no fever. Associated symptoms include abdominal pain, arthralgias and diarrhea. Pertinent negatives include no chills, no cough, no fever, no headaches, no myalgias, no sweats and no URI. Associated symptoms comments: The patient has low back pain from a recent surgery performed by Dr. Gerlene Fee on 02/21/2012, but states that the pain has not increased and is improving. This is not a new pain with the course of nausea vomiting and diarrhea.    The patient does have concerned about what she thinks is a recent infection in the surgical site postoperatively, which she has seen Dr. Gerlene Fee for within the past week and was started on antibiotics, doxycycline with improvement. She asks me to look at this surgical wound is well.. Risk factors: Recent hospitalization.  Diarrhea The primary symptoms include abdominal pain, nausea, vomiting, diarrhea and arthralgias. Primary symptoms do not include fever, weight loss, fatigue, melena, hematemesis, jaundice, hematochezia, dysuria or myalgias. The illness began 2 days ago. The onset was gradual. The problem has not changed since onset. The abdominal pain began 2 days ago. The abdominal pain has been gradually improving since its onset. The abdominal pain is generalized. The abdominal pain does not radiate. The severity of the  abdominal pain is 5/10.  The illness is also significant for anorexia and back pain. The illness does not include chills, dysphagia, odynophagia, bloating or constipation.    Past Medical History  Diagnosis Date  . Diabetes mellitus   . Bulging disc   . Bipolar disorder   . Depression     Past Surgical History  Procedure Date  . Low back surgery     x 3  . Cholecystectomy 1992  . Elbow surgery     fix tendon on left arm    Family History  Problem Relation Age of Onset  . Anesthesia problems Neg Hx     History  Substance Use Topics  . Smoking status: Never Smoker   . Smokeless tobacco: Not on file  . Alcohol Use: No    OB History    Grav Para Term Preterm Abortions TAB SAB Ect Mult Living                  Review of Systems  Constitutional: Positive for appetite change. Negative for fever, chills, weight loss, diaphoresis and fatigue.  HENT: Negative.   Respiratory: Negative.  Negative for cough and shortness of breath.   Cardiovascular: Negative.  Negative for chest pain and palpitations.  Gastrointestinal: Positive for nausea, vomiting, abdominal pain, diarrhea and anorexia. Negative for dysphagia, constipation, blood in stool, melena, hematochezia, abdominal distention, bloating, hematemesis and jaundice.  Genitourinary: Negative for dysuria, urgency, frequency and hematuria.  Musculoskeletal: Positive for back pain and arthralgias. Negative for myalgias.  Skin: Positive for wound. Negative for color change and pallor.  Neurological: Negative for dizziness, syncope, weakness, light-headedness, numbness  and headaches.  Psychiatric/Behavioral: Negative.     Allergies  Shellfish allergy and Penicillins  Home Medications   Current Outpatient Rx  Name Route Sig Dispense Refill  . VITAMIN D 2000 UNITS PO TABS Oral Take 4,000 Units by mouth daily.    . DESVENLAFAXINE SUCCINATE ER 50 MG PO TB24 Oral Take 50 mg by mouth daily.    Marland Kitchen DIVALPROEX SODIUM 500 MG PO TBEC  Oral Take 1,500 mg by mouth at bedtime.    Marland Kitchen DOXYCYCLINE HYCLATE 100 MG PO TABS Oral Take 100 mg by mouth 2 (two) times daily. 4 1/2 days left of 10 day course    . GLIPIZIDE ER 5 MG PO TB24 Oral Take 5 mg by mouth daily.    Marland Kitchen HYDROCODONE-ACETAMINOPHEN 5-325 MG PO TABS Oral Take 1 tablet by mouth 2 (two) times daily as needed. For pain    . LEUPROLIDE ACETATE (3 MONTH) 11.25 MG IM KIT Intramuscular Inject 11.25 mg into the muscle every 3 (three) months. Last inject about one week ago    . METFORMIN HCL 1000 MG PO TABS Oral Take 1,000 mg by mouth 2 (two) times daily with a meal.    . ADULT MULTIVITAMIN W/MINERALS CH Oral Take 1 tablet by mouth daily.    Marland Kitchen HYDROMORPHONE HCL 4 MG PO TABS Oral Take 1 tablet (4 mg total) by mouth every 3 (three) hours as needed. 75 tablet 0    BP 139/78  Pulse 103  Temp 99.1 F (37.3 C)  Resp 20  SpO2 98%  LMP 02/19/2012  Physical Exam  Nursing note and vitals reviewed. Constitutional: She is oriented to person, place, and time. She appears well-developed and well-nourished. She is active.  Non-toxic appearance. She does not have a sickly appearance. She does not appear ill. No distress.  HENT:  Head: Normocephalic and atraumatic.  Right Ear: Hearing, tympanic membrane, external ear and ear canal normal.  Left Ear: Hearing, tympanic membrane, external ear and ear canal normal.  Nose: Nose normal. No mucosal edema.  Mouth/Throat: Uvula is midline and oropharynx is clear and moist. Mucous membranes are dry. No oral lesions. No uvula swelling. No oropharyngeal exudate, posterior oropharyngeal edema, posterior oropharyngeal erythema or tonsillar abscesses.  Eyes: Conjunctivae and EOM are normal. Pupils are equal, round, and reactive to light. Right eye exhibits no chemosis, no discharge and no exudate. Left eye exhibits no chemosis, no discharge and no exudate. Right conjunctiva is not injected. Left conjunctiva is not injected. No scleral icterus.  Neck: Normal  range of motion, full passive range of motion without pain and phonation normal. Neck supple. No rigidity. No Brudzinski's sign noted.  Cardiovascular: Normal rate, regular rhythm, intact distal pulses and normal pulses.   No extrasystoles are present.  Pulmonary/Chest: Effort normal and breath sounds normal. No accessory muscle usage. Not tachypneic. No respiratory distress. She has no decreased breath sounds. She has no wheezes. She has no rhonchi. She has no rales. She exhibits no tenderness, no crepitus and no retraction.  Abdominal: Soft. Normal appearance and bowel sounds are normal. She exhibits no shifting dullness, no distension, no pulsatile liver, no fluid wave, no abdominal bruit, no ascites, no pulsatile midline mass and no mass. There is no hepatosplenomegaly. There is no tenderness. There is no rigidity, no rebound, no guarding, no CVA tenderness, no tenderness at McBurney's point and negative Murphy's sign. No hernia.  Musculoskeletal: Normal range of motion. She exhibits tenderness. She exhibits no edema.  Lumbar back: She exhibits tenderness, bony tenderness and pain. She exhibits normal range of motion, no swelling, no edema and no deformity.       Back:  Neurological: She is alert and oriented to person, place, and time. She has normal strength and normal reflexes. She is not disoriented. No cranial nerve deficit. Coordination normal. GCS eye subscore is 4. GCS verbal subscore is 5. GCS motor subscore is 6.  Skin: Skin is warm, dry and intact. No bruising, no ecchymosis, no lesion and no rash noted. She is not diaphoretic. No erythema. No pallor.  Psychiatric: She has a normal mood and affect. Her speech is normal and behavior is normal. Judgment and thought content normal. Cognition and memory are normal.    ED Course  Procedures (including critical care time)  Labs Reviewed  COMPREHENSIVE METABOLIC PANEL - Abnormal; Notable for the following:    Glucose, Bld 164 (*)     Albumin 3.4 (*)    All other components within normal limits  CBC - Abnormal; Notable for the following:    Hemoglobin 11.5 (*)    HCT 35.1 (*)    All other components within normal limits  URINALYSIS, ROUTINE W REFLEX MICROSCOPIC - Abnormal; Notable for the following:    Specific Gravity, Urine 1.003 (*)    All other components within normal limits  LIPASE, BLOOD  DIFFERENTIAL   No results found.   No diagnosis found.    MDM  The patient appears to have a viral gastroenteritis as the cause of her symptoms, symptoms which are now controlled after medications, and we have given her IV fluid rehydration. Her laboratory studies show no leukocytosis, she has no fever, her liver, pancreas, kidney functions are all normal, there is no urinary tract infection, and my overall impression of her postoperative surgical wound is that of normal healing without signs of superimposed infection. I've advised the patient I will discharge her home with symptomatic treatment for viral gastroenteritis, and that she needs to call her surgeon in the morning to arrange for an office evaluation this week for reevaluation of her post operative course and surgical wound. The patient states her understanding of and agreement with the plan of care.       Felisa Bonier, MD 03/08/12 [phone]

## 2012-03-08 NOTE — Discharge Instructions (Signed)
Clear Liquid Diet The clear liquid dietconsists of foods that are liquid or will become liquid at room temperature.You should be able to see through the liquid and beverages. Examples of foods allowed on a clear liquid diet include fruit juice, broth or bouillon, gelatin, or frozen ice pops. The purpose of this diet is to provide necessary fluid, electrolytes such as sodium and potassium, and energy to keep the body functioning during times when you are not able to consume a regular diet.A clear liquid diet should not be continued for long periods of time as it is not nutritionally adequate.  REASONS FOR USING A CLEAR LIQUID DIET  In sudden onset (acute) conditions for a patient before or after surgery.   As the first step in oral feeding.   For fluid and electrolyte replacement in diarrheal diseases.   As a diet before certain medical tests are performed.  ADEQUACY The clear liquid diet is adequate only in ascorbic acid, according to the Recommended Dietary Allowances of the [employer]. CHOOSING FOODS Breads and Starches  Allowed:  None are allowed.   Avoid: All are avoided.  Vegetables  Allowed:  Strained tomato or vegetable juice.   Avoid: Any others.  Fruit  Allowed:  Strained fruit juices and fruit drinks. Include 1 serving of citrus or vitamin C-enriched fruit juice daily.   Avoid: Any others.  Meat and Meat Substitutes  Allowed:  None are allowed.   Avoid: All are avoided.  Milk  Allowed:  None are allowed.   Avoid: All are avoided.  Soups and Combination Foods  Allowed:  Clear bouillon, broth, or strained broth-based soups.   Avoid: Any others.  Desserts and Sweets  Allowed:  Sugar, honey. High protein gelatin. Flavored gelatin, ices, or frozen ice pops that do not contain milk.   Avoid: Any others.  Fats and Oils  Allowed:  None are allowed.   Avoid: All are avoided.  Beverages  Allowed: Cereal beverages, coffee (regular or  decaffeinated), tea, or soda at the discretion of your caregiver.   Avoid: Any others.  Condiments  Allowed:  Iodized salt.   Avoid: Any others, including pepper.  Supplements  Allowed:  Liquid nutrition beverages.   Avoid: Any others that contain lactose or fiber.  SAMPLE MEAL PLAN Breakfast  4 oz (120 mL) strained orange juice.    to 1 cup (125 to 250 mL) gelatin (plain or fortified).   1 cup (250 mL) beverage (coffee or tea).   Sugar, if desired.  Midmorning Snack   cup (125 mL) gelatin (plain or fortified).  Lunch  1 cup (250 mL) broth or consomm.   4 oz (120 mL) strained grapefruit juice.    cup (125 mL) gelatin (plain or fortified).   1 cup (250 mL) beverage (coffee or tea).   Sugar, if desired.  Midafternoon Snack   cup (125 mL) fruit ice.    cup (125 mL) strained fruit juice.  Dinner  1 cup (250 mL) broth or consomm.    cup (125 mL) cranberry juice.    cup (125 mL) flavored gelatin (plain or fortified).   1 cup (250 mL) beverage (coffee or tea).   Sugar, if desired.  Evening Snack  4 oz (120 mL) strained apple juice (vitamin C-fortified).    cup (125 mL) flavored gelatin (plain or fortified).  Document Released: 11/25/2005 Document Revised: 11/14/2011 Document Reviewed: 02/22/2011 [facility] Patient Information 2012 [place], [place].Diet for Diarrhea, Adult Having frequent, runny stools (diarrhea) has many causes.  Diarrhea may be caused or worsened by food or drink. Diarrhea may be relieved by changing your diet. IF YOU ARE NOT TOLERATING SOLID FOODS:  Drink enough water and fluids to keep your urine clear or pale yellow.   Avoid sugary drinks and sodas as well as milk-based beverages.   Avoid beverages containing caffeine and alcohol.   You may try rehydrating beverages. You can make your own by following this recipe:    tsp table salt.    tsp baking soda.   ? tsp salt substitute (potassium chloride).   1 tbs + 1 tsp  sugar.   1 qt water.  As your stools become more solid, you can start eating solid foods. Add foods one at a time. If a certain food causes your diarrhea to get worse, avoid that food and try other foods. A low fiber, low-fat, and lactose-free diet is recommended. Small, frequent meals may be better tolerated.  Starches  Allowed:  White, [place], and pita breads, plain rolls, buns, bagels. Plain muffins, matzo. Soda, saltine, or graham crackers. Pretzels, melba toast, zwieback. Cooked cereals made with water: cornmeal, farina, cream cereals. Dry cereals: refined corn, wheat, rice. Potatoes prepared any way without skins, refined macaroni, spaghetti, noodles, refined rice.   Avoid:  Bread, rolls, or crackers made with whole wheat, multi-grains, rye, bran seeds, nuts, or coconut. Corn tortillas or taco shells. Cereals containing whole grains, multi-grains, bran, coconut, nuts, or raisins. Cooked or dry oatmeal. Coarse wheat cereals, granola. Cereals advertised as "high-fiber." Potato skins. Whole grain pasta, wild or brown rice. Popcorn. Sweet potatoes/yams. Sweet rolls, doughnuts, waffles, pancakes, sweet breads.  Vegetables  Allowed: Strained tomato and vegetable juices. Most well-cooked and canned vegetables without seeds. Fresh: Tender lettuce, cucumber without the skin, cabbage, spinach, bean sprouts.   Avoid: Fresh, cooked, or canned: Artichokes, baked beans, beet greens, broccoli, Brussels sprouts, corn, kale, legumes, peas, sweet potatoes. Cooked: Green or red cabbage, spinach. Avoid large servings of any vegetables, because vegetables shrink when cooked, and they contain more fiber per serving than fresh vegetables.  Fruit  Allowed: All fruit juices except prune juice. Cooked or canned: Apricots, applesauce, cantaloupe, cherries, fruit cocktail, grapefruit, grapes, kiwi, mandarin oranges, peaches, pears, plums, watermelon. Fresh: Apples without skin, ripe banana, grapes, cantaloupe, cherries,  grapefruit, peaches, oranges, plums. Keep servings limited to  cup or 1 piece.   Avoid: Fresh: Apple with skin, apricots, mango, pears, raspberries, strawberries. Prune juice, stewed or dried prunes. Dried fruits, raisins, dates. Large servings of all fresh fruits.  Meat and Meat Substitutes  Allowed: Ground or well-cooked tender beef, ham, veal, lamb, pork, or poultry. Eggs, plain cheese. Fish, oysters, shrimp, lobster, other seafoods. Liver, organ meats.   Avoid: Tough, fibrous meats with gristle. Peanut butter, smooth or chunky. Cheese, nuts, seeds, legumes, dried peas, beans, lentils.  Milk  Allowed: Yogurt, lactose-free milk, kefir, drinkable yogurt, buttermilk, soy milk.   Avoid: Milk, chocolate milk, beverages made with milk, such as milk shakes.  Soups  Allowed: Bouillon, broth, or soups made from allowed foods. Any strained soup.   Avoid: Soups made from vegetables that are not allowed, cream or milk-based soups.  Desserts and Sweets  Allowed: Sugar-free gelatin, sugar-free frozen ice pops made without sugar alcohol.   Avoid: Plain cakes and cookies, pie made with allowed fruit, pudding, custard, cream pie. Gelatin, fruit, ice, sherbet, frozen ice pops. Ice cream, ice milk without nuts. Plain hard candy, honey, jelly, molasses, syrup, sugar, chocolate syrup, gumdrops, marshmallows.  Fats  and Oils  Allowed: Avoid any fats and oils.   Avoid: Seeds, nuts, olives, avocados. Margarine, butter, cream, mayonnaise, salad oils, plain salad dressings made from allowed foods. Plain gravy, crisp bacon without rind.  Beverages  Allowed: Water, decaffeinated teas, oral rehydration solutions, sugar-free beverages.   Avoid: Fruit juices, caffeinated beverages (coffee, tea, soda or pop), alcohol, sports drinks, or lemon-lime soda or pop.  Condiments  Allowed: Ketchup, mustard, horseradish, vinegar, cream sauce, cheese sauce, cocoa powder. Spices in moderation: allspice, basil, bay leaves,  celery powder or leaves, cinnamon, cumin powder, curry powder, ginger, mace, marjoram, onion or garlic powder, oregano, paprika, parsley flakes, ground pepper, rosemary, sage, savory, tarragon, thyme, turmeric.   Avoid: Coconut, honey.  Weight Monitoring: Weigh yourself every day. You should weigh yourself in the morning after you urinate and before you eat breakfast. Wear the same amount of clothing when you weigh yourself. Record your weight daily. Bring your recorded weights to your clinic visits. Tell your caregiver right away if you have gained 3 lb/1.4 kg or more in 1 day, 5 lb/2.3 kg in a week, or whatever amount you were told to report. SEEK IMMEDIATE MEDICAL CARE IF:   You are unable to keep fluids down.   You start to throw up (vomit) or diarrhea keeps coming back (persistent).   Abdominal pain develops, increases, or can be felt in one place (localizes).   You have an oral temperature above 102 F (38.9 C), not controlled by medicine.   Diarrhea contains blood or mucus.   You develop excessive weakness, dizziness, fainting, or extreme thirst.  MAKE SURE YOU:   Understand these instructions.   Will watch your condition.   Will get help right away if you are not doing well or get worse.  Document Released: 02/15/2004 Document Revised: 11/14/2011 Document Reviewed: 06/08/2009 [facility] Patient Information 2012 [place], [place].Viral Infections A viral infection can be caused by different types of viruses.Most viral infections are not serious and resolve on their own. However, some infections may cause severe symptoms and may lead to further complications. SYMPTOMS Viruses can frequently cause:  Minor sore throat.   Aches and pains.   Headaches.   Runny nose.   Different types of rashes.   Watery eyes.   Tiredness.   Cough.   Loss of appetite.   Gastrointestinal infections, resulting in nausea, vomiting, and diarrhea.  These symptoms do not respond to  antibiotics because the infection is not caused by bacteria. However, you might catch a bacterial infection following the viral infection. This is sometimes called a "superinfection." Symptoms of such a bacterial infection may include:  Worsening sore throat with pus and difficulty swallowing.   Swollen neck glands.   Chills and a high or persistent fever.   Severe headache.   Tenderness over the sinuses.   Persistent overall ill feeling (malaise), muscle aches, and tiredness (fatigue).   Persistent cough.   Yellow, green, or brown mucus production with coughing.  HOME CARE INSTRUCTIONS   Only take over-the-counter or prescription medicines for pain, discomfort, diarrhea, or fever as directed by your caregiver.   Drink enough water and fluids to keep your urine clear or pale yellow. Sports drinks can provide valuable electrolytes, sugars, and hydration.   Get plenty of rest and maintain proper nutrition. Soups and broths with crackers or rice are fine.  SEEK IMMEDIATE MEDICAL CARE IF:   You have severe headaches, shortness of breath, chest pain, neck pain, or an unusual rash.   You  have uncontrolled vomiting, diarrhea, or you are unable to keep down fluids.   You or your child has an oral temperature above 102 F (38.9 C), not controlled by medicine.   Your baby is older than 3 months with a rectal temperature of 102 F (38.9 C) or higher.   Your baby is 28 months old or younger with a rectal temperature of 100.4 F (38 C) or higher.  MAKE SURE YOU:   Understand these instructions.   Will watch your condition.   Will get help right away if you are not doing well or get worse.  Document Released: 09/04/2005 Document Revised: 11/14/2011 Document Reviewed: 04/01/2011 [facility] Patient Information 2012 [place], [place].Viral Infections A viral infection can be caused by different types of viruses.Most viral infections are not serious and resolve on their own. However, some  infections may cause severe symptoms and may lead to further complications. SYMPTOMS Viruses can frequently cause:  Minor sore throat.   Aches and pains.   Headaches.   Runny nose.   Different types of rashes.   Watery eyes.   Tiredness.   Cough.   Loss of appetite.   Gastrointestinal infections, resulting in nausea, vomiting, and diarrhea.  These symptoms do not respond to antibiotics because the infection is not caused by bacteria. However, you might catch a bacterial infection following the viral infection. This is sometimes called a "superinfection." Symptoms of such a bacterial infection may include:  Worsening sore throat with pus and difficulty swallowing.   Swollen neck glands.   Chills and a high or persistent fever.   Severe headache.   Tenderness over the sinuses.   Persistent overall ill feeling (malaise), muscle aches, and tiredness (fatigue).   Persistent cough.   Yellow, green, or brown mucus production with coughing.  HOME CARE INSTRUCTIONS   Only take over-the-counter or prescription medicines for pain, discomfort, diarrhea, or fever as directed by your caregiver.   Drink enough water and fluids to keep your urine clear or pale yellow. Sports drinks can provide valuable electrolytes, sugars, and hydration.   Get plenty of rest and maintain proper nutrition. Soups and broths with crackers or rice are fine.  SEEK IMMEDIATE MEDICAL CARE IF:   You have severe headaches, shortness of breath, chest pain, neck pain, or an unusual rash.   You have uncontrolled vomiting, diarrhea, or you are unable to keep down fluids.   You or your child has an oral temperature above 102 F (38.9 C), not controlled by medicine.   Your baby is older than 3 months with a rectal temperature of 102 F (38.9 C) or higher.   Your baby is 62 months old or younger with a rectal temperature of 100.4 F (38 C) or higher.  MAKE SURE YOU:   Understand these instructions.    Will watch your condition.   Will get help right away if you are not doing well or get worse.  Document Released: 09/04/2005 Document Revised: 11/14/2011 Document Reviewed: 04/01/2011 [facility] Patient Information 2012 [place], [place].

## 2012-03-08 NOTE — ED Notes (Signed)
Pt to ED with c/o nausea, vomiting and diarrhea.  St's she had back surgery on 3/15 and now incision site is draining.  Has been using peroxide as instructed by MD but no better.

## 2012-03-19 ENCOUNTER — Encounter (HOSPITAL_BASED_OUTPATIENT_CLINIC_OR_DEPARTMENT_OTHER): Payer: 59 | Attending: Internal Medicine

## 2012-03-19 DIAGNOSIS — L84 Corns and callosities: Secondary | ICD-10-CM | POA: Insufficient documentation

## 2012-03-19 DIAGNOSIS — Y838 Other surgical procedures as the cause of abnormal reaction of the patient, or of later complication, without mention of misadventure at the time of the procedure: Secondary | ICD-10-CM | POA: Insufficient documentation

## 2012-03-19 DIAGNOSIS — M79609 Pain in unspecified limb: Secondary | ICD-10-CM | POA: Insufficient documentation

## 2012-03-19 DIAGNOSIS — Z79899 Other long term (current) drug therapy: Secondary | ICD-10-CM | POA: Insufficient documentation

## 2012-03-19 DIAGNOSIS — T8189XA Other complications of procedures, not elsewhere classified, initial encounter: Secondary | ICD-10-CM | POA: Insufficient documentation

## 2012-03-19 NOTE — Progress Notes (Signed)
[facility]  NAME:  Valerie Diaz, Valerie Diaz             ACCOUNT NO.:  [id number]  MEDICAL RECORD NO.:  [id number]      DATE OF BIRTH:  07-Mar-1966  PHYSICIAN:  Maxwell Caul, M.D. VISIT DATE:  03/19/2012                                  OFFICE VISIT   This is a 46 year old lady who arrives for our review of surgical wounds on her lower back.  She underwent surgery on February 21, 2012, by Dr. Gerlene Fee for lumbar spinal stenosis at L3-L4. L4-L5.  She underwent a right L3-L4, L4-L5 maximum access TLIF with interbody spacer followed by a left L3-L4, L4-L5 percutaneous pedicle screw fixation.  She appears to have tolerated the procedure well.  However, she has been left with small and surgical wounds on the right and left that are nonhealing. Her husband who was here tells me that he was using hydrogen peroxide to these for some period of time after the original surgery went to their primary physician who changed this to a topical ointment, although he does not know the name of this.  The patient complains of a lot of pain in the right leg.  The husband's concern is when they lift the right leg at night there was a small amount of bleeding from the right surgical incision.  There appears to be complaints of a fair amount of pain here.  PAST MEDICAL HISTORY:  Type 2 diabetes, bipolar disorder.  SURGICAL HISTORY: 1. Lumbar spinal spondylosis and stenosis surgery, described above. 2. Cholecystectomy. 3. Left elbow surgery. 4. Prior back surgery in 1986-1999.  MEDICATION LIST:  Reviewed.  She is on: 1. Dilaudid for pain. 2. Divalproex 500 mg 3 at bedtime. 3. Metformin a 1000 b.i.d. 4. Glipizide 5 daily. 5. Pristiq ER 50 daily. 6. Doxycycline 100 mg 2 times daily for 5 days. 7. Lupron 3.75 IM q.3 weeks.  PHYSICAL EXAMINATION:  Temperature is 98.6, pulse 123, respirations 16, blood pressure 132/90.  There are 3 are reasonably small areas on her right lower back and on  the left side roughly at the surgical areas described.  All of these wounds were in a similar state.  They had fibrinous eschar with surrounding callus.  The areas superiorly on the right is probably going to be the more difficult area as there was some depth to this with a nonviable surface that is going to need further debridement.  Otherwise, all of these cleaned up quite nicely.  I should say I see nothing here that really should be bleeding, as the husband described, and also there is no evidence of a significant clinical infection.  I did not find anything that required culturing.  IMPRESSIONS:  Surgical wound nonhealing.  These were debrided as noted above.  We applied Santyl to the linear wounds on the left part of her back, also the right side.  We covered this with a TL, which should be allowed as to remain in place until we see this next week.  We answered all of the patient's and her husband's questions.  As mentioned, I do not see anything here that really looks ominous or should be a significant source of difficulty.          ______________________________ Maxwell Caul, M.D.     MGR/MEDQ  D:  03/19/2012  T:  03/19/2012  Job:  [id number]

## 2012-04-09 ENCOUNTER — Encounter (HOSPITAL_BASED_OUTPATIENT_CLINIC_OR_DEPARTMENT_OTHER): Payer: 59 | Attending: Internal Medicine

## 2012-04-09 DIAGNOSIS — T8189XA Other complications of procedures, not elsewhere classified, initial encounter: Secondary | ICD-10-CM | POA: Insufficient documentation

## 2012-04-09 DIAGNOSIS — E119 Type 2 diabetes mellitus without complications: Secondary | ICD-10-CM | POA: Insufficient documentation

## 2012-04-09 DIAGNOSIS — Y838 Other surgical procedures as the cause of abnormal reaction of the patient, or of later complication, without mention of misadventure at the time of the procedure: Secondary | ICD-10-CM | POA: Insufficient documentation

## 2012-04-09 DIAGNOSIS — S21209A Unspecified open wound of unspecified back wall of thorax without penetration into thoracic cavity, initial encounter: Secondary | ICD-10-CM | POA: Insufficient documentation

## 2012-04-09 DIAGNOSIS — Z79899 Other long term (current) drug therapy: Secondary | ICD-10-CM | POA: Insufficient documentation

## 2012-04-16 ENCOUNTER — Encounter (HOSPITAL_BASED_OUTPATIENT_CLINIC_OR_DEPARTMENT_OTHER): Payer: Medicare Other

## 2012-05-18 ENCOUNTER — Other Ambulatory Visit: Payer: Self-pay | Admitting: Neurosurgery

## 2012-05-18 DIAGNOSIS — M545 Low back pain: Secondary | ICD-10-CM

## 2012-05-25 ENCOUNTER — Ambulatory Visit
Admission: RE | Admit: 2012-05-25 | Discharge: 2012-05-25 | Disposition: A | Discharge status: Home / Self Care | Payer: Medicare Other | Source: Ambulatory Visit | Attending: Neurosurgery | Admitting: Neurosurgery

## 2012-05-25 DIAGNOSIS — M545 Low back pain: Secondary | ICD-10-CM

## 2012-05-25 MED ORDER — GADOBENATE DIMEGLUMINE 529 MG/ML IV SOLN
15.0000 mL | Freq: Once | INTRAVENOUS | Status: AC | PRN
  Administered 2012-05-25: 15 mL via INTRAVENOUS

## 2012-08-12 ENCOUNTER — Encounter (HOSPITAL_COMMUNITY): Payer: Self-pay | Admitting: Pharmacist

## 2012-08-13 ENCOUNTER — Other Ambulatory Visit: Payer: Self-pay | Admitting: Obstetrics and Gynecology

## 2012-08-13 ENCOUNTER — Encounter (HOSPITAL_COMMUNITY): Payer: Self-pay | Admitting: Pharmacist

## 2012-08-20 ENCOUNTER — Encounter (HOSPITAL_COMMUNITY): Payer: Self-pay

## 2012-08-20 ENCOUNTER — Encounter (HOSPITAL_COMMUNITY)
Admission: RE | Admit: 2012-08-20 | Discharge: 2012-08-20 | Disposition: A | Discharge status: Home / Self Care | Payer: Medicare Other | Source: Ambulatory Visit | Attending: Obstetrics and Gynecology | Admitting: Obstetrics and Gynecology

## 2012-08-20 HISTORY — DX: Endometriosis, unspecified: N80.9

## 2012-08-20 HISTORY — DX: Gastro-esophageal reflux disease without esophagitis: K21.9

## 2012-08-20 LAB — BASIC METABOLIC PANEL
Chloride: 97 mEq/L (ref 96–112)
Creatinine, Ser: 0.53 mg/dL (ref 0.50–1.10)
GFR calc Af Amer: >90 mL/min (ref >90)
Potassium: 4.3 mEq/L (ref 3.5–5.1)
Sodium: 137 mEq/L (ref 135–145)

## 2012-08-20 LAB — PROTIME-INR
INR: 1 (ref 0.00–1.49)
Prothrombin Time: 13.4 seconds (ref 11.6–15.2)

## 2012-08-20 LAB — APTT: aPTT: 32 seconds (ref 24–37)

## 2012-08-20 LAB — CBC
MCV: 89 fL (ref 78.0–100.0)
Platelets: 228 10*3/uL (ref 150–400)
RDW: 14.2 % (ref 11.5–15.5)
WBC: 6.5 10*3/uL (ref 4.0–10.5)

## 2012-08-20 LAB — SURGICAL PCR SCREEN: MRSA, PCR: NEGATIVE

## 2012-08-20 NOTE — Patient Instructions (Addendum)
Your procedure is scheduled on:08/25/12  Enter through the Main Entrance at :10AM Pick up desk phone and dial [phone] and inform us of your arrival.  Please call [phone] if you have any problems the morning of surgery.  Remember: Do not eat after midnight: MONDAY Do not drink after:0730 AM TUESDAY  Take these meds the morning of surgery with a sip of water: none HOLD METFORMIN FOR 24 HRS PRIOR TO SURGERY  DO NOT wear jewelry, eye make-up, lipstick,body lotion, or dark fingernail polish. Do not shave for 48 hours prior to surgery.  If you are to be admitted after surgery, leave suitcase in car until your room has been assigned. Patients discharged on the day of surgery will not be allowed to drive home.   Remember to use your Hibiclens as instructed.

## 2012-08-20 NOTE — Pre-Procedure Instructions (Signed)
Dr Malen Gauze notified of pt's blood sugar of 262 and he instructed me to notify Dr. Charlott Rakes office. I gave the lab report to [facility].

## 2012-08-24 NOTE — H&P (Signed)
Valerie Diaz is an 46 y.o. female. With a long history of chronic pelvic pain. Ultrasound   evaluation of the pelvis was negative for significant anatomic abnormalities and the patient was ultimately treated with depo Lupron 3.75 mg every four weeks for 6 doses and the patient did have a good response. There was a recurrence of the symptoms after cessation of this therapy only to have a rapid return of her pelvic pain. Because of the rapid reoccurrence of these symptoms she now desires that a definitive procedure now be carried out and she is being admitted to undergo a total abdominal hysterectomy and bilateral salpingo-ophorectomy.  Pertinent Gynecological History: Menses: flow is moderate but with severe pain Contraception: none DES exposure: unknown Blood transfusions: none Sexually transmitted diseases: no past history Previous GYN Procedures: none  Last mammogram: normal Date: 07/15/2011 Last pap: normal Date:  06/2011  OB History: G0    Past Medical History  Diagnosis Date  . Diabetes mellitus   . Bulging disc   . Bipolar disorder   . Depression   . GERD (gastroesophageal reflux disease)   . Endometriosis     Past Surgical History  Procedure Date  . Low back surgery     x 3  . Cholecystectomy 1992  . Elbow surgery     fix tendon on left arm    Family History  Problem Relation Age of Onset  . Anesthesia problems Neg Hx     Social History:  reports that she has never smoked. She does not have any smokeless tobacco history on file. She reports that she does not drink alcohol or use illicit drugs.  Allergies:  Allergies  Allergen Reactions  . Shellfish Allergy Hives and Shortness Of Breath    Throat swelling  . Penicillins Hives    No prescriptions prior to admission    ROS  Respiratory: no SOB or Cough GI: positive for reflux no nausea, vomiting, diarrhea, or constipation GU: no dysuria, frequency or urgency Gyn: no discharge or itch. Positive  persistent pelvic pain and dysparuenia Neuro: chronic back pain and disk disease  There were no vitals taken for this visit. Physical Exam  Afebrile  BP  130/80   Pulse 84   Respirations 16  Head: normocephalic and atraumatic Eyes: PERRLA sclera are white, conjunctiva pink Neck: Supple no adenopathy and no jugular distention. Thyroid is not enlarged Chest: Clear to P&A Heart: regular rhythm no murmur or gallop Breast: no masses are noted Abdomen is soft the liver, kidneys and spleen are not enlarged Back: no CVA tenderness Pelvic Exam:   External Genitalia: are within normal limits                          BUS: WNL                           Vagina: no lesions are seen normal discharge is noted                          Cervix is without gross lesion but is tender to touch and motion                          Uterus: normal size and shape. Anterior and tender to touch and motion  Adnexa: no masses are noted   No results found for this or any previous visit (from the past 24 hour(s)).  No results found.  Assessment/Plan:  Chronic pelvic pain suspect endometriosis or adenomyosis  Plan: TAH BSO. The risks and benefits have been discussed including but not limited to post of infection, bleeding, injury to adjacent structures, blood clot formation.   Informed consent has been obtained.  Tavion Senkbeil 08/24/2012, 5:46 PM

## 2012-08-25 ENCOUNTER — Encounter (HOSPITAL_COMMUNITY): Payer: Self-pay | Admitting: Registered Nurse

## 2012-08-25 ENCOUNTER — Encounter (HOSPITAL_COMMUNITY)
Admission: RE | Disposition: A | Discharge status: Home / Self Care | Payer: Self-pay | Source: Ambulatory Visit | Attending: Obstetrics and Gynecology

## 2012-08-25 ENCOUNTER — Inpatient Hospital Stay (HOSPITAL_COMMUNITY)
Admission: RE | Admit: 2012-08-25 | Discharge: 2012-08-27 | DRG: 743 | Disposition: A | Discharge status: Home / Self Care | Payer: Medicare Other | Source: Ambulatory Visit | Attending: Obstetrics and Gynecology | Admitting: Obstetrics and Gynecology

## 2012-08-25 ENCOUNTER — Inpatient Hospital Stay (HOSPITAL_COMMUNITY): Payer: Medicare Other | Admitting: Registered Nurse

## 2012-08-25 ENCOUNTER — Other Ambulatory Visit (HOSPITAL_COMMUNITY): Payer: Self-pay | Admitting: Obstetrics and Gynecology

## 2012-08-25 ENCOUNTER — Encounter (HOSPITAL_COMMUNITY): Payer: Self-pay

## 2012-08-25 DIAGNOSIS — IMO0002 Reserved for concepts with insufficient information to code with codable children: Secondary | ICD-10-CM | POA: Diagnosis present

## 2012-08-25 DIAGNOSIS — N801 Endometriosis of ovary: Secondary | ICD-10-CM | POA: Diagnosis present

## 2012-08-25 DIAGNOSIS — N946 Dysmenorrhea, unspecified: Principal | ICD-10-CM | POA: Diagnosis present

## 2012-08-25 DIAGNOSIS — N803 Endometriosis of pelvic peritoneum, unspecified: Secondary | ICD-10-CM | POA: Diagnosis present

## 2012-08-25 DIAGNOSIS — N840 Polyp of corpus uteri: Secondary | ICD-10-CM | POA: Diagnosis present

## 2012-08-25 DIAGNOSIS — N949 Unspecified condition associated with female genital organs and menstrual cycle: Secondary | ICD-10-CM | POA: Diagnosis present

## 2012-08-25 DIAGNOSIS — N80109 Endometriosis of ovary, unspecified side, unspecified depth: Secondary | ICD-10-CM | POA: Diagnosis present

## 2012-08-25 DIAGNOSIS — N838 Other noninflammatory disorders of ovary, fallopian tube and broad ligament: Secondary | ICD-10-CM | POA: Diagnosis present

## 2012-08-25 HISTORY — PX: SALPINGOOPHORECTOMY: SHX82

## 2012-08-25 HISTORY — PX: ABDOMINAL HYSTERECTOMY: SHX81

## 2012-08-25 LAB — GLUCOSE, CAPILLARY
Glucose-Capillary: 215 mg/dL — ABNORMAL HIGH (ref 70–99)
Glucose-Capillary: 147 mg/dL — ABNORMAL HIGH (ref 70–99)
Glucose-Capillary: 153 mg/dL — ABNORMAL HIGH (ref 70–99)

## 2012-08-25 LAB — HEMOGLOBIN: Hemoglobin: 11.6 g/dL — ABNORMAL LOW (ref 12.0–15.0)

## 2012-08-25 SURGERY — HYSTERECTOMY, ABDOMINAL
Anesthesia: General | Site: Abdomen | Wound class: Clean Contaminated

## 2012-08-25 MED ORDER — GLYCOPYRROLATE 0.2 MG/ML IJ SOLN
INTRAMUSCULAR | Status: AC
  Filled 2012-08-25: qty 2

## 2012-08-25 MED ORDER — FENTANYL CITRATE 0.05 MG/ML IJ SOLN
INTRAMUSCULAR | Status: AC
  Filled 2012-08-25: qty 5

## 2012-08-25 MED ORDER — HYDROMORPHONE HCL PF 1 MG/ML IJ SOLN
INTRAMUSCULAR | Status: AC
  Administered 2012-08-25: 0.5 mg via INTRAVENOUS
  Filled 2012-08-25: qty 1

## 2012-08-25 MED ORDER — METOCLOPRAMIDE HCL 5 MG/ML IJ SOLN: 10.0000 mg | Freq: Once | INTRAMUSCULAR | Status: DC | PRN

## 2012-08-25 MED ORDER — MENTHOL 3 MG MT LOZG: 1.0000 | LOZENGE | OROMUCOSAL | Status: DC | PRN

## 2012-08-25 MED ORDER — LACTATED RINGERS IV SOLN
INTRAVENOUS | Status: DC
  Administered 2012-08-25 (×3): via INTRAVENOUS

## 2012-08-25 MED ORDER — PROPOFOL 10 MG/ML IV EMUL
INTRAVENOUS | Status: AC
  Filled 2012-08-25: qty 20

## 2012-08-25 MED ORDER — LIDOCAINE HCL (CARDIAC) 20 MG/ML IV SOLN
INTRAVENOUS | Status: DC | PRN
  Administered 2012-08-25: 100 mg via INTRAVENOUS

## 2012-08-25 MED ORDER — LIDOCAINE HCL (CARDIAC) 20 MG/ML IV SOLN
INTRAVENOUS | Status: AC
  Filled 2012-08-25: qty 5

## 2012-08-25 MED ORDER — HYDROMORPHONE HCL PF 1 MG/ML IJ SOLN
INTRAMUSCULAR | Status: AC
  Filled 2012-08-25: qty 1

## 2012-08-25 MED ORDER — NALOXONE HCL 0.4 MG/ML IJ SOLN: 0.4000 mg | INTRAMUSCULAR | Status: DC | PRN

## 2012-08-25 MED ORDER — MIDAZOLAM HCL 2 MG/2ML IJ SOLN
INTRAMUSCULAR | Status: AC
  Filled 2012-08-25: qty 2

## 2012-08-25 MED ORDER — ONDANSETRON HCL 4 MG/2ML IJ SOLN
4.0000 mg | Freq: Four times a day (QID) | INTRAMUSCULAR | Status: DC | PRN
  Administered 2012-08-26: 4 mg via INTRAVENOUS
  Filled 2012-08-25: qty 2

## 2012-08-25 MED ORDER — GLYCOPYRROLATE 0.2 MG/ML IJ SOLN
INTRAMUSCULAR | Status: DC | PRN
  Administered 2012-08-25: 0.4 mg via INTRAVENOUS

## 2012-08-25 MED ORDER — HYDROMORPHONE HCL PF 1 MG/ML IJ SOLN
0.2500 mg | INTRAMUSCULAR | Status: DC | PRN
  Administered 2012-08-25 (×4): 0.5 mg via INTRAVENOUS

## 2012-08-25 MED ORDER — DEXAMETHASONE SODIUM PHOSPHATE 10 MG/ML IJ SOLN
INTRAMUSCULAR | Status: AC
  Filled 2012-08-25: qty 1

## 2012-08-25 MED ORDER — LABETALOL HCL 5 MG/ML IV SOLN
INTRAVENOUS | Status: DC | PRN
  Administered 2012-08-25 (×2): 5 mg via INTRAVENOUS

## 2012-08-25 MED ORDER — NEOSTIGMINE METHYLSULFATE 1 MG/ML IJ SOLN
INTRAMUSCULAR | Status: DC | PRN
  Administered 2012-08-25: 3 mg via INTRAVENOUS

## 2012-08-25 MED ORDER — MEPERIDINE HCL 25 MG/ML IJ SOLN: 6.2500 mg | INTRAMUSCULAR | Status: DC | PRN

## 2012-08-25 MED ORDER — CEFAZOLIN SODIUM-DEXTROSE 2-3 GM-% IV SOLR
INTRAVENOUS | Status: AC
  Filled 2012-08-25: qty 50

## 2012-08-25 MED ORDER — HYDROMORPHONE 0.3 MG/ML IV SOLN
INTRAVENOUS | Status: DC
  Administered 2012-08-25: 4.5 mg via INTRAVENOUS
  Administered 2012-08-25 (×2): via INTRAVENOUS
  Administered 2012-08-25 – 2012-08-26 (×2): 3.6 mg via INTRAVENOUS
  Administered 2012-08-26: 07:00:00 via INTRAVENOUS
  Administered 2012-08-26: 6 mL via INTRAVENOUS
  Administered 2012-08-26: 1.8 mg via INTRAVENOUS
  Filled 2012-08-25 (×3): qty 25

## 2012-08-25 MED ORDER — DIPHENHYDRAMINE HCL 50 MG/ML IJ SOLN: 12.5000 mg | Freq: Four times a day (QID) | INTRAMUSCULAR | Status: DC | PRN

## 2012-08-25 MED ORDER — HYDROMORPHONE HCL PF 1 MG/ML IJ SOLN
INTRAMUSCULAR | Status: AC
  Administered 2012-08-25: 0.5 mg via INTRAVENOUS
  Filled 2012-08-25: qty 2

## 2012-08-25 MED ORDER — BUPIVACAINE HCL (PF) 0.5 % IJ SOLN
INTRAMUSCULAR | Status: AC
  Filled 2012-08-25: qty 270

## 2012-08-25 MED ORDER — SODIUM CHLORIDE 0.9 % IJ SOLN: 9.0000 mL | INTRAMUSCULAR | Status: DC | PRN

## 2012-08-25 MED ORDER — OXYCODONE-ACETAMINOPHEN 5-325 MG PO TABS: 1.0000 | ORAL_TABLET | ORAL | Status: DC | PRN

## 2012-08-25 MED ORDER — FENTANYL CITRATE 0.05 MG/ML IJ SOLN
INTRAMUSCULAR | Status: DC | PRN
  Administered 2012-08-25 (×2): 50 ug via INTRAVENOUS
  Administered 2012-08-25 (×2): 100 ug via INTRAVENOUS
  Administered 2012-08-25: 50 ug via INTRAVENOUS
  Administered 2012-08-25: 150 ug via INTRAVENOUS

## 2012-08-25 MED ORDER — INSULIN GLARGINE 100 UNIT/ML ~~LOC~~ SOLN
5.0000 [IU] | Freq: Every day | SUBCUTANEOUS | Status: DC
  Administered 2012-08-25 – 2012-08-26 (×2): 5 [IU] via SUBCUTANEOUS
  Filled 2012-08-25: qty 3

## 2012-08-25 MED ORDER — LACTATED RINGERS IV SOLN
INTRAVENOUS | Status: DC
  Administered 2012-08-25 – 2012-08-26 (×3): via INTRAVENOUS

## 2012-08-25 MED ORDER — DIVALPROEX SODIUM 500 MG PO DR TAB
1500.0000 mg | DELAYED_RELEASE_TABLET | Freq: Every day | ORAL | Status: DC
  Administered 2012-08-25 – 2012-08-26 (×2): 1500 mg via ORAL
  Filled 2012-08-25 (×3): qty 3

## 2012-08-25 MED ORDER — MIDAZOLAM HCL 5 MG/5ML IJ SOLN
INTRAMUSCULAR | Status: DC | PRN
  Administered 2012-08-25: 2 mg via INTRAVENOUS

## 2012-08-25 MED ORDER — ONDANSETRON HCL 4 MG/2ML IJ SOLN
INTRAMUSCULAR | Status: AC
  Filled 2012-08-25: qty 2

## 2012-08-25 MED ORDER — LIDOCAINE HCL 1 % IJ SOLN
INTRAMUSCULAR | Status: DC | PRN
  Administered 2012-08-25: 20 mL

## 2012-08-25 MED ORDER — PROPOFOL 10 MG/ML IV BOLUS
INTRAVENOUS | Status: DC | PRN
  Administered 2012-08-25: 200 mg via INTRAVENOUS

## 2012-08-25 MED ORDER — INFLUENZA VIRUS VACC SPLIT PF IM SUSP
0.5000 mL | INTRAMUSCULAR | Status: AC
  Administered 2012-08-26: 0.5 mL via INTRAMUSCULAR
  Filled 2012-08-25: qty 0.5

## 2012-08-25 MED ORDER — DIPHENHYDRAMINE HCL 12.5 MG/5ML PO ELIX
12.5000 mg | ORAL_SOLUTION | Freq: Four times a day (QID) | ORAL | Status: DC | PRN
  Filled 2012-08-25: qty 5

## 2012-08-25 MED ORDER — HYDROMORPHONE HCL PF 1 MG/ML IJ SOLN
0.5000 mg | INTRAMUSCULAR | Status: DC | PRN
  Administered 2012-08-25 (×2): 0.5 mg via INTRAVENOUS

## 2012-08-25 MED ORDER — HYDROMORPHONE HCL PF 1 MG/ML IJ SOLN
2.0000 mg | Freq: Once | INTRAMUSCULAR | Status: AC
  Administered 2012-08-25: 2 mg via INTRAVENOUS

## 2012-08-25 MED ORDER — ONDANSETRON HCL 4 MG/2ML IJ SOLN
INTRAMUSCULAR | Status: DC | PRN
  Administered 2012-08-25: 4 mg via INTRAVENOUS

## 2012-08-25 MED ORDER — INSULIN ASPART 100 UNIT/ML ~~LOC~~ SOLN
0.0000 [IU] | Freq: Three times a day (TID) | SUBCUTANEOUS | Status: DC
  Administered 2012-08-25: 2 [IU] via SUBCUTANEOUS
  Administered 2012-08-26 (×3): 3 [IU] via SUBCUTANEOUS
  Administered 2012-08-27: 2 [IU] via SUBCUTANEOUS
  Administered 2012-08-27: 3 [IU] via SUBCUTANEOUS

## 2012-08-25 MED ORDER — IBUPROFEN 600 MG PO TABS
600.0000 mg | ORAL_TABLET | Freq: Four times a day (QID) | ORAL | Status: DC | PRN
  Administered 2012-08-26 – 2012-08-27 (×5): 600 mg via ORAL
  Filled 2012-08-25 (×5): qty 1

## 2012-08-25 MED ORDER — ROCURONIUM BROMIDE 50 MG/5ML IV SOLN
INTRAVENOUS | Status: AC
  Filled 2012-08-25: qty 1

## 2012-08-25 MED ORDER — SENSORCAINE 0.25 % 270ML FOR PAIN PUMP OPTIME
INTRAMUSCULAR | Status: DC | PRN
  Administered 2012-08-25: 270 mL

## 2012-08-25 MED ORDER — BUPIVACAINE 0.25 % ON-Q PUMP DUAL CATH 400 ML
400.0000 mL | INJECTION | Status: DC
  Filled 2012-08-25: qty 400

## 2012-08-25 MED ORDER — ROCURONIUM BROMIDE 100 MG/10ML IV SOLN
INTRAVENOUS | Status: DC | PRN
  Administered 2012-08-25: 60 mg via INTRAVENOUS
  Administered 2012-08-25: 5 mg via INTRAVENOUS

## 2012-08-25 MED ORDER — HYDROMORPHONE HCL PF 1 MG/ML IJ SOLN
INTRAMUSCULAR | Status: DC | PRN
  Administered 2012-08-25: 1 mg via INTRAVENOUS

## 2012-08-25 MED ORDER — CEFAZOLIN SODIUM-DEXTROSE 2-3 GM-% IV SOLR
2.0000 g | Freq: Once | INTRAVENOUS | Status: AC
  Administered 2012-08-25: 2 g via INTRAVENOUS

## 2012-08-25 MED ORDER — LABETALOL HCL 5 MG/ML IV SOLN
INTRAVENOUS | Status: AC
  Filled 2012-08-25: qty 4

## 2012-08-25 SURGICAL SUPPLY — 27 items
CANISTER SUCTION 2500CC (MISCELLANEOUS) ×3 IMPLANT
CLOTH BEACON ORANGE TIMEOUT ST (SAFETY) ×3 IMPLANT
CONT PATH 16OZ SNAP LID 3702 (MISCELLANEOUS) ×3 IMPLANT
DRESSING TELFA 8X3 (GAUZE/BANDAGES/DRESSINGS) ×1 IMPLANT
GAUZE SPONGE 4X4 12PLY STRL LF (GAUZE/BANDAGES/DRESSINGS) ×1 IMPLANT
GLOVE BIO SURGEON STRL SZ7.5 (GLOVE) ×6 IMPLANT
GOWN PREVENTION PLUS LG XLONG (DISPOSABLE) ×6 IMPLANT
GOWN PREVENTION PLUS XXLARGE (GOWN DISPOSABLE) ×3 IMPLANT
NS IRRIG 1000ML POUR BTL (IV SOLUTION) ×3 IMPLANT
PACK ABDOMINAL GYN (CUSTOM PROCEDURE TRAY) ×3 IMPLANT
PAD ABD 7.5X8 STRL (GAUZE/BANDAGES/DRESSINGS) ×1 IMPLANT
PAD OB MATERNITY 4.3X12.25 (PERSONAL CARE ITEMS) ×3 IMPLANT
PAIN PUMP ON-Q 270MLX4ML 5IN (PAIN MANAGEMENT) ×1 IMPLANT
PROTECTOR NERVE ULNAR (MISCELLANEOUS) ×3 IMPLANT
SPONGE LAP 18X18 X RAY DECT (DISPOSABLE) ×6 IMPLANT
STAPLER VISISTAT 35W (STAPLE) ×3 IMPLANT
SUT VIC AB 0 CT1 18XCR BRD8 (SUTURE) ×6 IMPLANT
SUT VIC AB 0 CT1 27 (SUTURE) ×12
SUT VIC AB 0 CT1 27XBRD ANBCTR (SUTURE) ×8 IMPLANT
SUT VIC AB 0 CT1 8-18 (SUTURE) ×9
SUT VIC AB 2-0 SH 27 (SUTURE)
SUT VIC AB 2-0 SH 27XBRD (SUTURE) IMPLANT
SUT VICRYL 0 TIES 12 18 (SUTURE) ×3 IMPLANT
TAPE CLOTH SURG 4X10 WHT LF (GAUZE/BANDAGES/DRESSINGS) ×1 IMPLANT
TOWEL OR 17X24 6PK STRL BLUE (TOWEL DISPOSABLE) ×6 IMPLANT
TRAY FOLEY CATH 14FR (SET/KITS/TRAYS/PACK) ×3 IMPLANT
WATER STERILE IRR 1000ML POUR (IV SOLUTION) ×3 IMPLANT

## 2012-08-25 NOTE — Preoperative (Signed)
Beta Blockers   Reason not to administer Beta Blockers:Not Applicable 

## 2012-08-25 NOTE — Anesthesia Postprocedure Evaluation (Signed)
  Anesthesia Post-op Note  Patient: Valerie Diaz  Procedure(s) Performed: Procedure(s) (LRB) with comments: HYSTERECTOMY ABDOMINAL (N/A) SALPINGO OOPHERECTOMY (Bilateral)  Patient Location: PACU  Anesthesia Type: General  Level of Consciousness: awake, alert  and oriented  Airway and Oxygen Therapy: Patient Spontanous Breathing  Post-op Pain: mild, moderate  Post-op Assessment: Post-op Vital signs reviewed and Patient's Cardiovascular Status Stable  Post-op Vital Signs: Reviewed and stable  Complications: No apparent anesthesia complications

## 2012-08-25 NOTE — Anesthesia Preprocedure Evaluation (Signed)
Anesthesia Evaluation    Airway Mallampati: III TM Distance: >3 FB Neck ROM: Full    Dental No notable dental hx. (+) Teeth Intact   Pulmonary neg pulmonary ROS,  breath sounds clear to auscultation  Pulmonary exam normal       Cardiovascular negative cardio ROS  Rhythm:Regular Rate:Normal     Neuro/Psych PSYCHIATRIC DISORDERS Depression Bipolar Disorder negative neurological ROS     GI/Hepatic Neg liver ROS, GERD-  Controlled,  Endo/Other  diabetes, Well Controlled, Type 2, Oral Hypoglycemic AgentsMorbid obesity  Renal/GU negative Renal ROS  negative genitourinary   Musculoskeletal negative musculoskeletal ROS (+)   Abdominal (+) + obese,   Peds  Hematology negative hematology ROS (+)   Anesthesia Other Findings   Reproductive/Obstetrics Endometriosis                           Anesthesia Physical Anesthesia Plan  ASA: III  Anesthesia Plan: General   Post-op Pain Management:    Induction: Intravenous  Airway Management Planned: Oral ETT  Additional Equipment:   Intra-op Plan:   Post-operative Plan:   Informed Consent: I have reviewed the patients History and Physical, chart, labs and discussed the procedure including the risks, benefits and alternatives for the proposed anesthesia with the patient or authorized representative who has indicated his/her understanding and acceptance.   Dental advisory given  Plan Discussed with: CRNA, Anesthesiologist and Surgeon  Anesthesia Plan Comments:         Anesthesia Quick Evaluation

## 2012-08-25 NOTE — H&P (Signed)
Status is unchanged will proceed with planned total abdominal hysterectomy and bilateral salpingo oophorectomy. Needs glucose checked pre op.

## 2012-08-25 NOTE — Brief Op Note (Signed)
08/25/2012  12:45 PM  PATIENT:  Laurene Footman Deweese  46 y.o. female  PRE-OPERATIVE DIAGNOSIS:  Chronic Pelvic Pain  POST-OPERATIVE DIAGNOSIS:  Chronic Pelvic Pain  PROCEDURE:  Procedure(s) (LRB) with comments: HYSTERECTOMY ABDOMINAL (N/A) SALPINGO OOPHERECTOMY (Bilateral)  SURGEON:  Surgeon(s) and Role:    * Miguel Aschoff, MD - Primary    * W Lodema Hong, MD - Assisting   ANESTHESIA:   general  EBL:  Total I/O In: 2000 [I.V.:2000] Out: 250 [Urine:150; Blood:100]  BLOOD ADMINISTERED:none  DRAINS: Urinary Catheter (Foley)   LOCAL MEDICATIONS USED:  LIDOCAINE   SPECIMEN:  Source of Specimen:  Cervix uterus tubes and ovaries  DISPOSITION OF SPECIMEN:  PATHOLOGY  COUNTS:  YES  TOURNIQUET:  * No tourniquets in log *  DICTATION: .Other Dictation: Dictation Number [id number]  PLAN OF CARE: Admit to inpatient   PATIENT DISPOSITION:  PACU - hemodynamically stable.   Delay start of Pharmacological VTE agent (>24hrs) due to surgical blood loss or risk of bleeding: Patient has PAS hose for blood clot prevention

## 2012-08-25 NOTE — Transfer of Care (Signed)
Immediate Anesthesia Transfer of Care Note  Patient: Valerie Diaz  Procedure(s) Performed: Procedure(s) (LRB) with comments: HYSTERECTOMY ABDOMINAL (N/A) SALPINGO OOPHERECTOMY (Bilateral)  Patient Location: PACU  Anesthesia Type: General  Level of Consciousness: awake, alert  and patient cooperative  Airway & Oxygen Therapy: Patient Spontanous Breathing and Patient connected to nasal cannula oxygen  Post-op Assessment: Report given to PACU RN  Post vital signs: Reviewed  Complications: No apparent anesthesia complications

## 2012-08-25 NOTE — Anesthesia Postprocedure Evaluation (Signed)
  Anesthesia Post-op Note  Patient: Valerie Diaz  Procedure(s) Performed: Procedure(s) (LRB) with comments: HYSTERECTOMY ABDOMINAL (N/A) SALPINGO OOPHERECTOMY (Bilateral) Patient is awake and responsive. Pain and nausea are reasonably well controlled. Vital signs are stable and clinically acceptable. Oxygen saturation is clinically acceptable. There are no apparent anesthetic complications at this time. Patient is ready for discharge.

## 2012-08-26 ENCOUNTER — Encounter (HOSPITAL_COMMUNITY): Payer: Self-pay | Admitting: *Deleted

## 2012-08-26 LAB — CBC
MCH: 28.3 pg (ref 26.0–34.0)
MCHC: 31.1 g/dL (ref 30.0–36.0)
Platelets: 249 10*3/uL (ref 150–400)
RDW: 14.8 % (ref 11.5–15.5)

## 2012-08-26 LAB — GLUCOSE, CAPILLARY: Glucose-Capillary: 187 mg/dL — ABNORMAL HIGH (ref 70–99)

## 2012-08-26 MED ORDER — OXYCODONE-ACETAMINOPHEN 5-325 MG PO TABS
1.0000 | ORAL_TABLET | ORAL | Status: DC | PRN
  Administered 2012-08-26 – 2012-08-27 (×6): 2 via ORAL
  Filled 2012-08-26 (×6): qty 2

## 2012-08-26 NOTE — Progress Notes (Signed)
S: Stable this AM. Still on PCA with reasonable control of the pain.  O: Afebrile BP 139/87   Pulse 107  Abdominal dressing is dry normal amount of tenderness is noted.  Lab  Hg  11.3  WBC 7.7  Assessment: Stable S/P TAH BSO  Plan: Increase diet           Ambulate            D/C foley            Try to get off PCA

## 2012-08-26 NOTE — Progress Notes (Signed)
Ur chart review completed.  

## 2012-08-26 NOTE — Op Note (Signed)
NAME:  Valerie Diaz, Valerie Diaz NO.:  [id number]  MEDICAL RECORD NO.:  [id number]  LOCATION:  9311                          FACILITY:  [facility]  PHYSICIAN:  Miguel Aschoff, M.D.       DATE OF BIRTH:  30-Oct-1966  DATE OF PROCEDURE:  08/25/2012 DATE OF DISCHARGE:                              OPERATIVE REPORT   PREOPERATIVE DIAGNOSES:  Chronic pelvic pain and endometriosis.  POSTOPERATIVE DIAGNOSES:  Chronic pelvic pain and endometriosis.  OPERATIONS AND PROCEDURES:  Total abdominal hysterectomy and bilateral salpingo-oophorectomy, fulguration of endometriosis.  SURGEON:  Miguel Aschoff, M.D.  ASSISTANT:  Luvenia Redden, M.D.  ANESTHESIA:  General.  COMPLICATIONS:  None.  JUSTIFICATION:  The patient is a 46 year old white female with history of worsening dysmenorrhea, dyspareunia, and pelvic pain.  The patient was treated in the past with depot Lupron therapy, which did improve her symptoms; however, now because of progression of her pelvic pain, she is expected desire to undergo definitive procedure rather than another course of Lupron.  The risks and benefits of the procedure were discussed with the patient.  Informed consent has been obtained.  The patient does understand that this surgery will result in loss of her ovaries and that she will need to take hormone replacement therapy.  PROCEDURE:  The patient was taken to the operating room and placed in the supine position, and general anesthesia was administered without difficulty.  Foley catheter was inserted while in the supine position. After being prepped and draped, a Pfannenstiel incision was made, extended down through the subcutaneous tissue with bleeding points being clamped and coagulated as they were encountered.  The fascia was then identified and incised transversely and separated from the underlying rectus muscles.  Rectus muscles were divided in the midline.  The peritoneum was then found and entered carefully  avoiding underlying structures.  At this point, the peritoneal incision was extended with care to avoid underlying structures.  At this point, a self-retaining retractor was placed through the wound and the viscera were packed out of the pelvis, and inspection at this point revealed the uterus that was actually very small.  The ovaries appeared to be in normal limits.  The anterior bladder peritoneum was unremarkable.  The appendix was visualized and was noted to be within normal limits.  There were no abnormalities noted in the right lower quadrant otherwise.  In the cul- de-sac, however, there were implants characteristic of endometriosis, one significant implant on the left uterosacral ligament.  After this was done, the round ligaments were identified, clamped, cut, and suture ligated using suture ligatures of 0 Vicryl, then an anterior bladder flap was created and taken down across the cervix.  The broad ligament was skeletonized.  A perforation was made below the utero-ovarian ligament.  The infundibulopelvic ligaments were identified, the ureters were identified and infundibulopelvic ligaments were clamped with Heaney clamps.  These pedicles were then cut and suture ligated using suture ligatures of 0 Vicryl and then free ties of 0 Vicryl.  The broad ligament was further skeletonized.  The uterine vessel was identified, clamped with curved Heaney clamps.  The pedicle was cut and suture ligated using suture ligatures of 0  Vicryl.  Then in serial fashion, the paracervical fascia was clamped, cut and suture ligated using suture ligatures of 0 Vicryl followed by the cardinal ligaments and uterosacral ligaments.  Once this was done, it was possible to cut the specimen freed, the specimen consisting of the tubes, ovaries, uterus and cervix. The vaginal cuff was then closed using interrupted 0 Vicryl sutures. The angles of the cuff were ligated using figure-of-eight sutures of 0 Vicryl  with uterosacral ligaments being incorporated for support.  After this was done, an inspection was made for hemostasis.  Hemostasis appeared to be excellent and at this point, the pelvis was irrigated with warm saline.  Lap counts and instrument counts were taken and found to be correct and then the abdomen was closed.  The parietal peritoneum was closed using running continuous 0 Vicryl suture.  The rectus muscles were reapproximated using running continuous 0 Vicryl suture.  The fascia was closed using two sutures of 0 Vicryl each side of the lateral vaginal angles and meeting in the midline.  Prior to closing the fascia, an On-Q catheter was placed in the subfascial space with the second catheter being placed in the subcutaneous space.  Then, the subcutaneous tissue was closed using interrupted 0 Vicryl sutures and then, the skin incision was closed using subcuticular 4-0 Vicryl suture.  The estimated blood loss was approximately 100 mL.  The patient tolerated the procedure well and went to the recovery room in satisfactory condition.     Miguel Aschoff, M.D.     AR/MEDQ  D:  08/25/2012  T:  08/26/2012  Job:  [id number]

## 2012-08-27 LAB — GLUCOSE, CAPILLARY: Glucose-Capillary: 137 mg/dL — ABNORMAL HIGH (ref 70–99)

## 2012-08-27 NOTE — Progress Notes (Signed)
S: Reports feeling more sore this AM. Still has ON Q in her incision. Also reporting drainage from incision.  O: Afebrile   Pulse 94    BP 134/69  Abdomen is soft. Dressing was removed incision is clean and dry. No sign of separation or hematoma. Drainage was coming from On Q site.  On Q removed without problem  A: Stable will observe in house and reassess wound this PM. May be able to go home today or in AM 9/20.

## 2012-08-27 NOTE — Progress Notes (Signed)
S: Feeling much better wants to go home  O: Afebrile   V/S stable  Pulse 100  Respirations 22  Abdomen is soft wound clean and dry  Path  Endometriosis and endometrial polyp  Plan: Discharge home           Return to office in 4 weeks          No heavy lifting and nothing per vagina          Meds:  Tylox one every three to four hours as needed for pain                      Estradiol  1mg  daily                      Resume all prior meds          To call for fever, severe pain or heavy bleeding           Condition improved

## 2012-08-27 NOTE — Progress Notes (Signed)
Discharged instructions reviewed with patient and husband at bedside.  No questions at this time.  Patient has prescriptions and personal belongings.  Patient left unit in stable condition.  Osvaldo Angst, RN-------

## 2012-09-03 NOTE — Discharge Summary (Signed)
Valerie Diaz, Valerie Diaz             ACCOUNT NO.:  [id number]  MEDICAL RECORD NO.:  [id number]  LOCATION:  9311                          FACILITY:  [facility]  PHYSICIAN:  Miguel Aschoff, M.D.       DATE OF BIRTH:  April 07, 1966  DATE OF ADMISSION:  08/25/2012 DATE OF DISCHARGE:  08/27/2012                              DISCHARGE SUMMARY   PREOPERATIVE DIAGNOSES:  Chronic pelvic pain, endometriosis.  FINAL DIAGNOSES:  Chronic pelvic pain, endometriosis.  OPERATIONS AND PROCEDURES:  Total abdominal hysterectomy and bilateral salpingo-oophorectomy.  SURGEON:  Miguel Aschoff, MD  ANESTHESIA:  General.  BRIEF HISTORY:  The patient is a 46 year old white female with history of persistent chronic pelvic pain.  The patient had been treated with Depo Lupron therapy for several months with some improvement of her symptomatology.  Stopping the Depo Lupron, she had recurrence in her symptoms and did not want to have any further courses of Depo Lupron in an effort to control her symptomatology.  Because of her chronic pelvic pain and dyspareunia, she had requested that a definitive procedure to be carried out, and because of this, she was admitted to the hospital to undergo total abdominal hysterectomy and bilateral salpingo- oophorectomy.  HOSPITAL COURSE:  Preoperative studies were obtained.  This showed a chemistry profile that was within normal limits except for elevated glucose at 262.  CBC was within normal limits.  Her hemoglobin was in the 12 range.  PT and PTT were also within normal limits.  On August 25, 2012, under general anesthesia, total abdominal hysterectomy was carried out without difficulty.  At the time of surgery, the only surgical findings were small implant of endometriosis on the uterosacral ligaments.  The uterus appeared to be small, the ovaries appeared to be within normal limits as was the tubes.  No other abnormalities were noted in the abdomen.  The surgical procedure was  carried out without difficulty.  The patient remained stable.  She was placed on insulin regimen following the surgery with her glucose levels monitored and controlled.  Her postop hemoglobin nadir to 11.3, her white count remained normal.  By the second postoperative day, the patient was tolerating regular diet, ambulating well, and was felt to be stable enough to be discharged home on oral medications.  The patient was instructed to resume all her preoperative medications.  She was sent home on Tylox 1 every 3 hours as needed for pain.  She was instructed to do no heavy lifting, place nothing in the vagina and to call if there are any problems such as fever, pain, or heavy bleeding.  Her pathology report revealed a small uterus weighing 28 g.  She was noted to have an endometrial polyp.  Focal endometriosis was noted on the ovaries, and tubes were within normal limits.  She was also sent home on estradiol 1 mg daily.  She was instructed to return to the office in 4 weeks for followup examination.  She was sent home on a carbohydrate restricted diet.  Told to resume all her preoperative medications. She is to call for any problems such as fever, pain, or heavy bleeding. She was instructed to place nothing in the vagina  for 2 weeks and to return to the office in 4 weeks for followup examination.  She was sent home in satisfactory condition on a regular diet.     Miguel Aschoff, M.D.     AR/MEDQ  D:  09/02/2012  T:  09/03/2012  Job:  [id number]

## 2013-06-09 IMAGING — CT CT L SPINE W/ CM
4 of 10 series · 12 of 33 positions shown, 14 images · non-contrast
Comparison: none

CLINICAL DATA: Multiple prior spinal surgeries.  Right-sided low
back pain and right radicular symptoms.
TECHNIQUE: Contiguous axial images were obtained through the lumbar
spine without infusion. Coronal, sagittal, and disc space
reconstructions were obtained of the axial image sets.

[Series 2: l spine bone · axial · 0.27mm/px · z∈[-44,+28]mm · 2 of 87 slices shown, 3 images]
[im 29/87  soft-tissue]
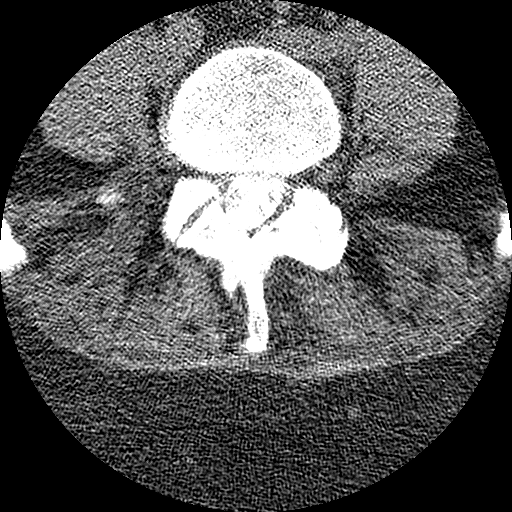
[im 29/87  bone]
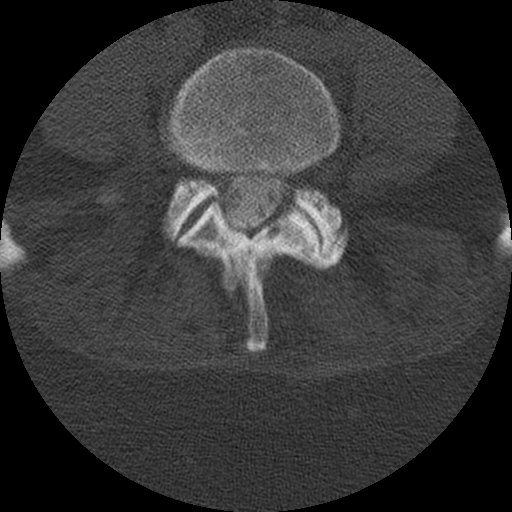
[im 58/87  bone]
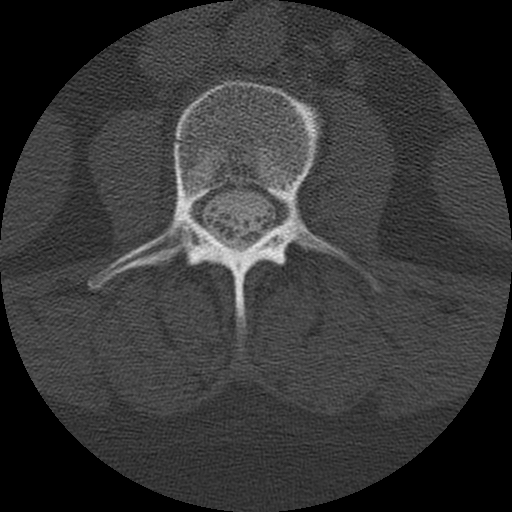

[Series 3: l spine soft · axial · 0.27mm/px · z∈[-44,+28]mm · 2 of 87 slices shown]
[im 29/87  soft-tissue]
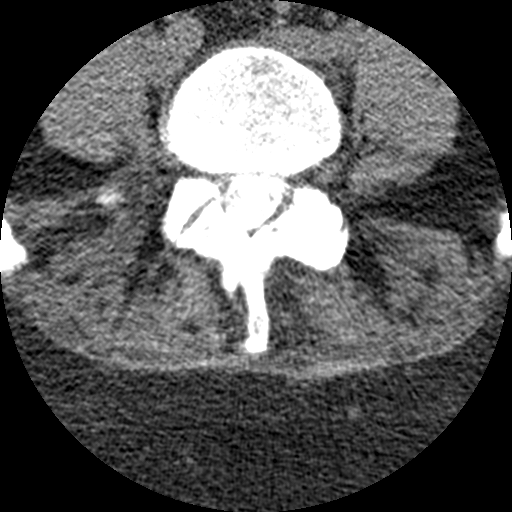
[im 58/87  soft-tissue]
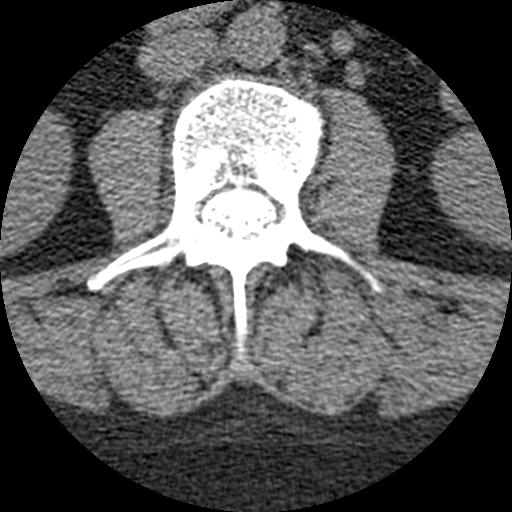

[Series 400: cor · coronal · 0.43mm/px · 3 of 50 slices shown]
[im 10/50  bone]
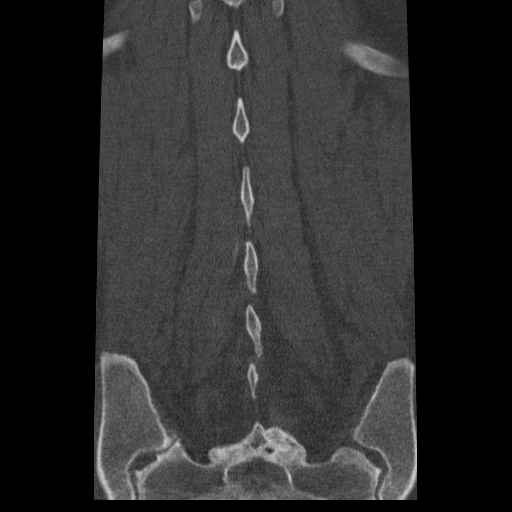
[im 20/50  bone]
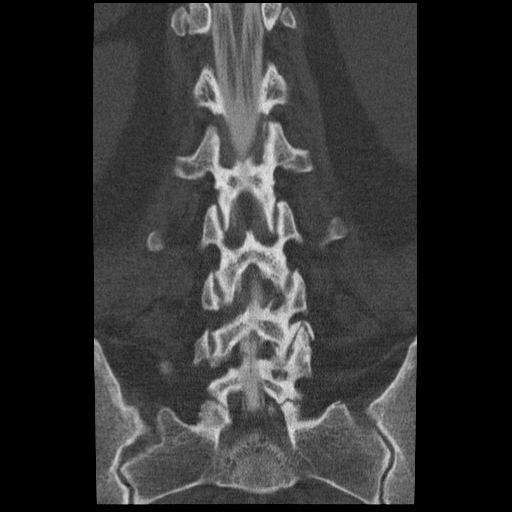
[im 30/50  bone]
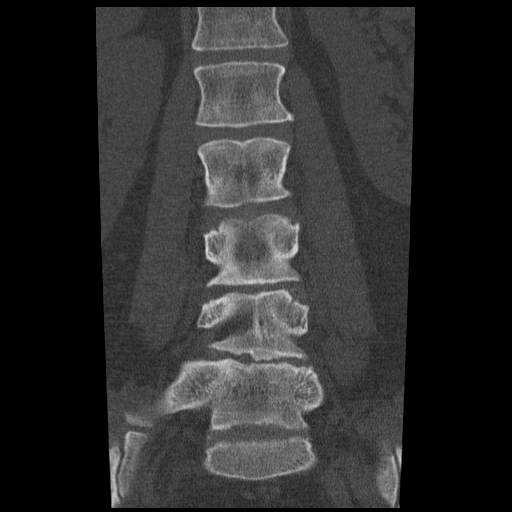

[Series 401: sag · sagittal · 0.43mm/px · 5 of 40 slices shown, 6 images]
[im 14/40  bone]
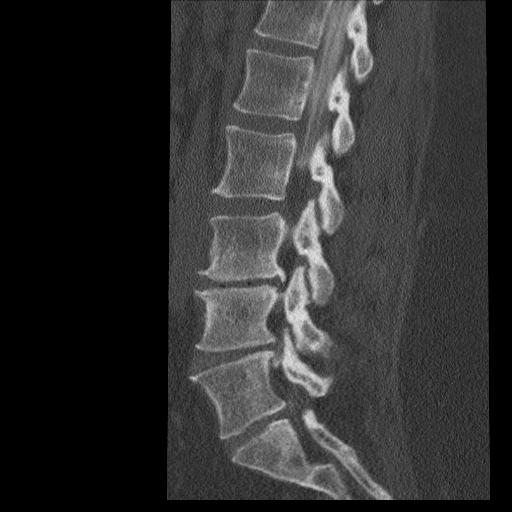
[im 17/40  bone]
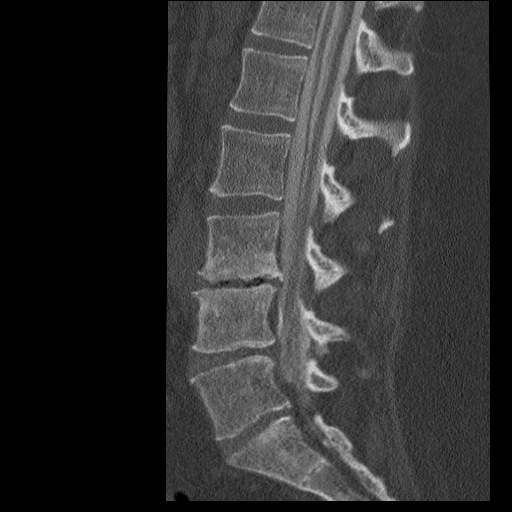
[im 20/40  soft-tissue]
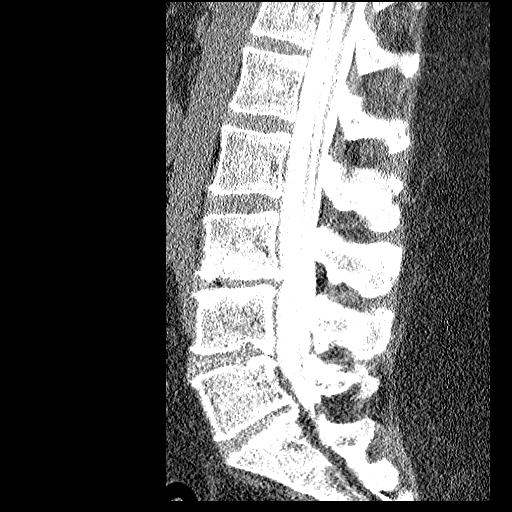
[im 20/40  bone]
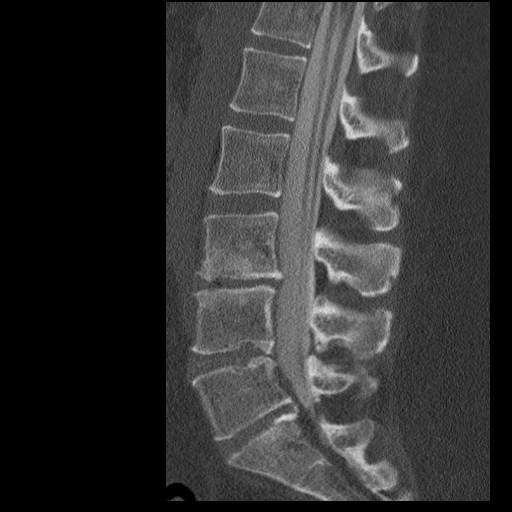
[im 23/40  bone]
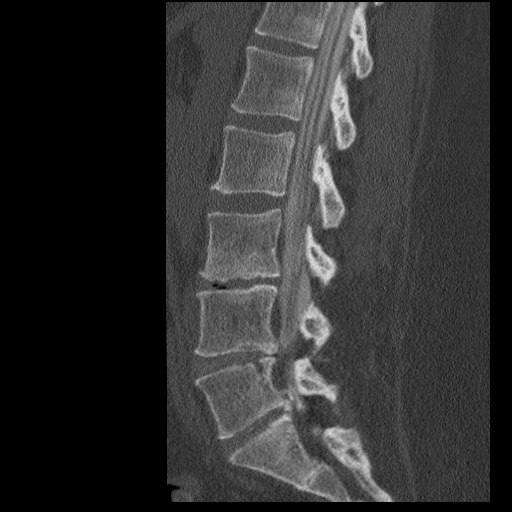
[im 27/40  bone]
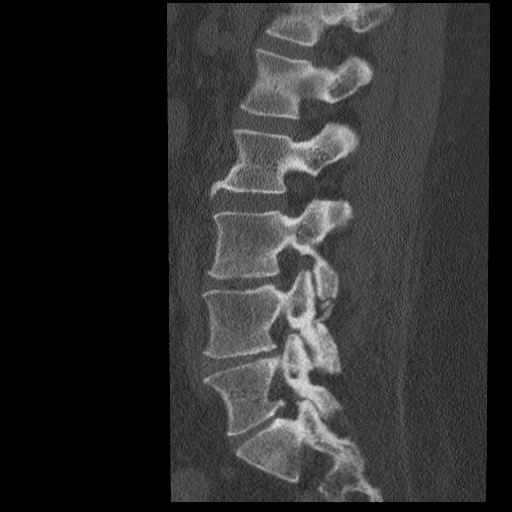

[12 of 33 positions shown; findings below may reference images not displayed]

LUMBAR MYELOGRAM

Procedure: After thorough discussion of risks and benefits of the
procedure including bleeding, infection, injury to nerves, blood
vessels, adjacent structures as well as headache and CSF leak,
written and oral informed consent was obtained.   Consent was
obtained by Dr.Astrid.

Patient was positioned prone on the fluoroscopy table. Local
anesthesia was provided with 1% lidocaine without epinephrine after
prepped and draped in the usual sterile fashion. Puncture was
performed at L3-L4 using a 5 inches 22-gauge spinal needle via
right paramedian approach.  Using a single pass through the dura,
the needle was placed within the thecal sac, with return of clear
CSF. 15 mL of Smnipaque-H0A was injected into the thecal sac, with
normal opacification of the nerve roots and cauda equina consistent
with free flow within the subarachnoid space.

Fluoroscopy time: 43 seconds
FINDINGS: The alignment shows grade 1 retrolisthesis of L3 on L4,
measuring 2 mm.  This is present in the neutral position and with
extension.  This reduces to anatomic with flexion.  Flexion and
extension shows little movement.  No other instability is
identified.  Cholecystectomy clips are present in the abdomen.
Sacralization of the right L5 transverse process is present.  There
is a shallow broad-based impression on the anterior thecal sac L3-
L4 compatible with disc.  There is mild levoconvex curvature with
the apex at L3-L4.
IMPRESSION: 1.  Technically successful lumbar puncture at L3-L4 for lumbar
myelogram.
2.  Grade 1 retrolisthesis of L4 on L5 measures 2 mm.  2 mm change
with flexion.  In flexion, the alignment becomes anatomic.
3.  Multilevel degenerative disease, most pronounced at L3-L4 with
anterior impression on the thecal sac.

CT LUMBAR MYELOGRAM
FINDINGS: The numbering convention used on this exam maintains the
numbering scheme from lumbar spine MRI 10/29/2011.  There is a
dextroconvex mild scoliosis with the apex at L3-L4.  Grade 1
retrolisthesis of L3 on L4 is present with supine positioning,
measuring between 2 mm and 3 mm.  Spinal cord terminates posterior
to the T12 vertebra.  The paraspinal soft tissues are within normal
limits.  Vertebral body height is preserved.  Bilateral SI joint
degenerative disease is present.

T12-L1:  Negative.

L1-L2:  Negative.

L2-L3:  Mild left facet arthrosis.  No stenosis.

L3-L4:  Vacuum disc with severe disc degeneration.  Shallow broad-
based disc bulge and endplate osteophytes are present, eccentric to
the right.  Left neural foramen and lateral recess are patent.
Mild right foraminal encroachment associated with endplate
osteophytes and bulging disc.  This potentially irritate the
exiting right L3 nerve.  No neural compression.  Asymmetric disc
space loss on the right.

L4-L5:  Disc desiccation and degeneration.  Small endplate
osteophytes are present.  Endplate osteophytes are left eccentric.
Moderate left and mild right facet arthrosis.  Right lateral recess
stenosis is present associated with endplate osteophytes and facet
hypertrophy.  Left neural foramen is patent.  Moderate right
foraminal stenosis associated with bulging disc and endplate
osteophytes as well as facet hypertrophy.

L5-S1:  Degenerated disc with annular calcification.  Moderate left
and mild right facet arthrosis.  Left-sided endplate spurring
encroaching on the left neural foramen, potentially affecting the
left L5 nerve.  Osteophytic spurring encroaches on the descending
left S1 nerve in the lateral recess.  Right facet disease
encroaches on the descending right S1 nerve in the lateral recess.
The right neural foramen appears patent.  Sacralization of the
right L5 transverse process is noted with minimal degeneration of
the pseudoarthrosis.
IMPRESSION: 1.  L3-L4 predominant degenerative disc disease.  Shallow broad-
based disc bulge with mild right foraminal encroachment potentially
irritating the right L3 nerve.  Severe disc degeneration.
2.  L4-L5 moderate left and mild right facet arthrosis.  Right
lateral recess stenosis potentially affects the descending right L5
nerve.  Moderate right foraminal stenosis potentially affecting the
right L4 nerve.  Central canal patent.
3.  L5-S1 the moderate left and mild right facet arthrosis.
Encroachment on both S1 nerves in the lateral recess.  Left
foraminal stenosis potentially affecting the left L5 nerve.

## 2013-09-21 DIAGNOSIS — M549 Dorsalgia, unspecified: Secondary | ICD-10-CM | POA: Diagnosis present

## 2013-12-07 DIAGNOSIS — F17200 Nicotine dependence, unspecified, uncomplicated: Secondary | ICD-10-CM | POA: Diagnosis present

## 2014-07-19 DIAGNOSIS — F339 Major depressive disorder, recurrent, unspecified: Secondary | ICD-10-CM | POA: Insufficient documentation

## 2017-03-12 DIAGNOSIS — R21 Rash and other nonspecific skin eruption: Secondary | ICD-10-CM | POA: Diagnosis not present

## 2017-03-12 DIAGNOSIS — Z79899 Other long term (current) drug therapy: Secondary | ICD-10-CM | POA: Diagnosis not present

## 2017-03-12 DIAGNOSIS — E119 Type 2 diabetes mellitus without complications: Secondary | ICD-10-CM | POA: Diagnosis not present

## 2017-03-12 DIAGNOSIS — I10 Essential (primary) hypertension: Secondary | ICD-10-CM | POA: Diagnosis not present

## 2017-03-12 DIAGNOSIS — Z1389 Encounter for screening for other disorder: Secondary | ICD-10-CM | POA: Diagnosis not present

## 2017-03-12 DIAGNOSIS — E785 Hyperlipidemia, unspecified: Secondary | ICD-10-CM | POA: Diagnosis not present

## 2017-03-13 DIAGNOSIS — M25561 Pain in right knee: Secondary | ICD-10-CM | POA: Diagnosis not present

## 2017-03-13 DIAGNOSIS — M7731 Calcaneal spur, right foot: Secondary | ICD-10-CM | POA: Diagnosis not present

## 2017-03-13 DIAGNOSIS — M25471 Effusion, right ankle: Secondary | ICD-10-CM | POA: Diagnosis not present

## 2017-03-13 DIAGNOSIS — M25571 Pain in right ankle and joints of right foot: Secondary | ICD-10-CM | POA: Diagnosis not present

## 2017-06-05 DIAGNOSIS — S46112A Strain of muscle, fascia and tendon of long head of biceps, left arm, initial encounter: Secondary | ICD-10-CM | POA: Diagnosis not present

## 2017-06-05 DIAGNOSIS — M25532 Pain in left wrist: Secondary | ICD-10-CM | POA: Diagnosis not present

## 2017-06-05 DIAGNOSIS — M791 Myalgia: Secondary | ICD-10-CM | POA: Diagnosis not present

## 2017-06-05 DIAGNOSIS — M25522 Pain in left elbow: Secondary | ICD-10-CM | POA: Diagnosis not present

## 2017-06-10 DIAGNOSIS — M7712 Lateral epicondylitis, left elbow: Secondary | ICD-10-CM | POA: Diagnosis not present

## 2017-07-15 DIAGNOSIS — Z79899 Other long term (current) drug therapy: Secondary | ICD-10-CM | POA: Diagnosis not present

## 2017-07-15 DIAGNOSIS — E119 Type 2 diabetes mellitus without complications: Secondary | ICD-10-CM | POA: Diagnosis not present

## 2017-07-15 DIAGNOSIS — E559 Vitamin D deficiency, unspecified: Secondary | ICD-10-CM | POA: Diagnosis not present

## 2017-07-15 DIAGNOSIS — E785 Hyperlipidemia, unspecified: Secondary | ICD-10-CM | POA: Diagnosis not present

## 2017-07-15 DIAGNOSIS — R300 Dysuria: Secondary | ICD-10-CM | POA: Diagnosis not present

## 2017-10-15 DIAGNOSIS — Z79899 Other long term (current) drug therapy: Secondary | ICD-10-CM | POA: Diagnosis not present

## 2017-10-15 DIAGNOSIS — I10 Essential (primary) hypertension: Secondary | ICD-10-CM | POA: Diagnosis not present

## 2017-10-15 DIAGNOSIS — E785 Hyperlipidemia, unspecified: Secondary | ICD-10-CM | POA: Diagnosis not present

## 2017-10-15 DIAGNOSIS — E119 Type 2 diabetes mellitus without complications: Secondary | ICD-10-CM | POA: Diagnosis not present

## 2017-10-15 DIAGNOSIS — E559 Vitamin D deficiency, unspecified: Secondary | ICD-10-CM | POA: Diagnosis not present

## 2017-10-24 DIAGNOSIS — M5440 Lumbago with sciatica, unspecified side: Secondary | ICD-10-CM | POA: Diagnosis not present

## 2017-11-13 DIAGNOSIS — E119 Type 2 diabetes mellitus without complications: Secondary | ICD-10-CM | POA: Diagnosis not present

## 2017-11-13 DIAGNOSIS — R300 Dysuria: Secondary | ICD-10-CM | POA: Diagnosis not present

## 2018-02-12 DIAGNOSIS — E559 Vitamin D deficiency, unspecified: Secondary | ICD-10-CM | POA: Diagnosis not present

## 2018-02-12 DIAGNOSIS — Z79899 Other long term (current) drug therapy: Secondary | ICD-10-CM | POA: Diagnosis not present

## 2018-02-12 DIAGNOSIS — I10 Essential (primary) hypertension: Secondary | ICD-10-CM | POA: Diagnosis not present

## 2018-02-12 DIAGNOSIS — E1165 Type 2 diabetes mellitus with hyperglycemia: Secondary | ICD-10-CM | POA: Diagnosis not present

## 2018-02-12 DIAGNOSIS — E785 Hyperlipidemia, unspecified: Secondary | ICD-10-CM | POA: Diagnosis not present

## 2018-03-08 DIAGNOSIS — R232 Flushing: Secondary | ICD-10-CM | POA: Diagnosis not present

## 2018-03-08 DIAGNOSIS — N951 Menopausal and female climacteric states: Secondary | ICD-10-CM | POA: Diagnosis not present

## 2018-03-08 DIAGNOSIS — R9431 Abnormal electrocardiogram [ECG] [EKG]: Secondary | ICD-10-CM | POA: Diagnosis not present

## 2018-03-08 DIAGNOSIS — R110 Nausea: Secondary | ICD-10-CM | POA: Diagnosis not present

## 2018-03-08 DIAGNOSIS — R531 Weakness: Secondary | ICD-10-CM | POA: Diagnosis not present

## 2018-03-10 DIAGNOSIS — E1165 Type 2 diabetes mellitus with hyperglycemia: Secondary | ICD-10-CM | POA: Diagnosis not present

## 2018-03-10 DIAGNOSIS — E039 Hypothyroidism, unspecified: Secondary | ICD-10-CM | POA: Diagnosis not present

## 2018-03-10 DIAGNOSIS — H3530 Unspecified macular degeneration: Secondary | ICD-10-CM | POA: Diagnosis not present

## 2018-03-26 DIAGNOSIS — G8929 Other chronic pain: Secondary | ICD-10-CM | POA: Diagnosis not present

## 2018-03-26 DIAGNOSIS — E1165 Type 2 diabetes mellitus with hyperglycemia: Secondary | ICD-10-CM | POA: Diagnosis not present

## 2018-03-26 DIAGNOSIS — E785 Hyperlipidemia, unspecified: Secondary | ICD-10-CM | POA: Diagnosis not present

## 2018-03-26 DIAGNOSIS — Z79899 Other long term (current) drug therapy: Secondary | ICD-10-CM | POA: Diagnosis not present

## 2018-03-26 DIAGNOSIS — Z1331 Encounter for screening for depression: Secondary | ICD-10-CM | POA: Diagnosis not present

## 2018-05-28 DIAGNOSIS — M25522 Pain in left elbow: Secondary | ICD-10-CM | POA: Diagnosis not present

## 2018-06-01 DIAGNOSIS — M7712 Lateral epicondylitis, left elbow: Secondary | ICD-10-CM | POA: Diagnosis not present

## 2018-06-12 DIAGNOSIS — S82201A Unspecified fracture of shaft of right tibia, initial encounter for closed fracture: Secondary | ICD-10-CM | POA: Diagnosis not present

## 2018-06-12 DIAGNOSIS — S99911A Unspecified injury of right ankle, initial encounter: Secondary | ICD-10-CM | POA: Diagnosis not present

## 2018-06-12 DIAGNOSIS — S8261XA Displaced fracture of lateral malleolus of right fibula, initial encounter for closed fracture: Secondary | ICD-10-CM | POA: Diagnosis not present

## 2018-06-12 DIAGNOSIS — M25571 Pain in right ankle and joints of right foot: Secondary | ICD-10-CM | POA: Diagnosis not present

## 2018-06-12 DIAGNOSIS — M7989 Other specified soft tissue disorders: Secondary | ICD-10-CM | POA: Diagnosis not present

## 2018-06-17 DIAGNOSIS — W19XXXA Unspecified fall, initial encounter: Secondary | ICD-10-CM | POA: Diagnosis not present

## 2018-06-17 DIAGNOSIS — S0993XA Unspecified injury of face, initial encounter: Secondary | ICD-10-CM | POA: Diagnosis not present

## 2018-06-17 DIAGNOSIS — S0083XA Contusion of other part of head, initial encounter: Secondary | ICD-10-CM | POA: Diagnosis not present

## 2018-06-17 DIAGNOSIS — R600 Localized edema: Secondary | ICD-10-CM | POA: Diagnosis not present

## 2018-06-17 DIAGNOSIS — S0990XA Unspecified injury of head, initial encounter: Secondary | ICD-10-CM | POA: Diagnosis not present

## 2018-06-17 DIAGNOSIS — R4020 Unspecified coma: Secondary | ICD-10-CM | POA: Diagnosis not present

## 2018-06-19 DIAGNOSIS — S93401A Sprain of unspecified ligament of right ankle, initial encounter: Secondary | ICD-10-CM | POA: Diagnosis not present

## 2018-06-19 DIAGNOSIS — S8264XA Nondisplaced fracture of lateral malleolus of right fibula, initial encounter for closed fracture: Secondary | ICD-10-CM | POA: Diagnosis not present

## 2018-06-22 DIAGNOSIS — S92909D Unspecified fracture of unspecified foot, subsequent encounter for fracture with routine healing: Secondary | ICD-10-CM | POA: Diagnosis not present

## 2018-06-25 DIAGNOSIS — E119 Type 2 diabetes mellitus without complications: Secondary | ICD-10-CM | POA: Diagnosis not present

## 2018-06-26 DIAGNOSIS — E119 Type 2 diabetes mellitus without complications: Secondary | ICD-10-CM | POA: Diagnosis not present

## 2018-06-26 DIAGNOSIS — R5382 Chronic fatigue, unspecified: Secondary | ICD-10-CM | POA: Diagnosis not present

## 2018-06-26 DIAGNOSIS — I10 Essential (primary) hypertension: Secondary | ICD-10-CM | POA: Diagnosis not present

## 2018-06-26 DIAGNOSIS — E785 Hyperlipidemia, unspecified: Secondary | ICD-10-CM | POA: Diagnosis not present

## 2018-06-29 DIAGNOSIS — E785 Hyperlipidemia, unspecified: Secondary | ICD-10-CM | POA: Diagnosis not present

## 2018-07-16 DIAGNOSIS — S92909D Unspecified fracture of unspecified foot, subsequent encounter for fracture with routine healing: Secondary | ICD-10-CM | POA: Diagnosis not present

## 2018-07-16 DIAGNOSIS — E119 Type 2 diabetes mellitus without complications: Secondary | ICD-10-CM | POA: Diagnosis not present

## 2018-07-16 DIAGNOSIS — Z79891 Long term (current) use of opiate analgesic: Secondary | ICD-10-CM | POA: Diagnosis not present

## 2018-07-24 DIAGNOSIS — S8264XA Nondisplaced fracture of lateral malleolus of right fibula, initial encounter for closed fracture: Secondary | ICD-10-CM | POA: Diagnosis not present

## 2018-08-17 DIAGNOSIS — E119 Type 2 diabetes mellitus without complications: Secondary | ICD-10-CM | POA: Diagnosis not present

## 2018-09-16 DIAGNOSIS — E119 Type 2 diabetes mellitus without complications: Secondary | ICD-10-CM | POA: Diagnosis not present

## 2018-09-24 DIAGNOSIS — Z79891 Long term (current) use of opiate analgesic: Secondary | ICD-10-CM | POA: Diagnosis not present

## 2018-12-18 DIAGNOSIS — R0902 Hypoxemia: Secondary | ICD-10-CM

## 2018-12-18 DIAGNOSIS — J209 Acute bronchitis, unspecified: Secondary | ICD-10-CM

## 2018-12-18 DIAGNOSIS — I10 Essential (primary) hypertension: Secondary | ICD-10-CM

## 2018-12-18 DIAGNOSIS — E119 Type 2 diabetes mellitus without complications: Secondary | ICD-10-CM

## 2019-01-13 DIAGNOSIS — R0602 Shortness of breath: Secondary | ICD-10-CM | POA: Insufficient documentation

## 2019-08-02 ENCOUNTER — Encounter: Payer: Self-pay | Admitting: Endocrinology

## 2020-01-06 ENCOUNTER — Other Ambulatory Visit: Payer: Self-pay

## 2020-01-10 ENCOUNTER — Ambulatory Visit (INDEPENDENT_AMBULATORY_CARE_PROVIDER_SITE_OTHER): Payer: Self-pay | Admitting: Internal Medicine

## 2020-01-10 ENCOUNTER — Encounter: Payer: Self-pay | Admitting: Internal Medicine

## 2020-01-10 ENCOUNTER — Other Ambulatory Visit: Payer: Self-pay

## 2020-01-10 VITALS — BP 110/64 | HR 101 | Ht 65.0 in | Wt 219.2 lb

## 2020-01-10 DIAGNOSIS — Z794 Long term (current) use of insulin: Secondary | ICD-10-CM

## 2020-01-10 DIAGNOSIS — E119 Type 2 diabetes mellitus without complications: Secondary | ICD-10-CM | POA: Insufficient documentation

## 2020-01-10 DIAGNOSIS — D3501 Benign neoplasm of right adrenal gland: Secondary | ICD-10-CM | POA: Insufficient documentation

## 2020-01-10 DIAGNOSIS — E1165 Type 2 diabetes mellitus with hyperglycemia: Secondary | ICD-10-CM

## 2020-01-10 LAB — POCT GLYCOSYLATED HEMOGLOBIN (HGB A1C): Hemoglobin A1C: 10.4 % — AB (ref 4.0–5.6)

## 2020-01-10 LAB — POTASSIUM: Potassium: 4 mEq/L (ref 3.5–5.1)

## 2020-01-10 MED ORDER — DEXAMETHASONE 1 MG PO TABS: ORAL_TABLET | ORAL | 0 refills | Status: DC

## 2020-01-10 NOTE — Progress Notes (Signed)
Patient ID: Valerie Diaz, female   DOB: 1966/09/30, 54 y.o.   MRN: [id number]   This visit occurred during the SARS-CoV-2 public health emergency.  Safety protocols were in place, including screening questions prior to the visit, additional usage of staff PPE, and extensive cleaning of exam room while observing appropriate contact time as indicated for disinfecting solutions.   HPI: Valerie Diaz is a 54 y.o.-year-old female, referred by her PCP, Dr. York Ram, for management of DM2, dx in her 20s, insulin-dependent since 08/2019, uncontrolled, without long-term complications.  She had a separate referral from Dr. Ralene Ok for R adrenal incidentaloma.  DM2:  Reviewed latest HbA1c level: 05/31/2019: HbA1c 9.9% No results found for: HGBA1C  Pt is on a regimen of: - Glimepiride 8 mg before breakfast  - Januvia 100 mg before breakfast - Invokana 100 mg before breakfast  - Levemir 35 >> 60 units at bedtime - Lispro 3x a day, before meals 150-199: 6 units 200-250: 8 units 251-300: 10 units She was on Trulicity and Ozempic >> neck swelling, nausea, vomiting.  Pt checks her sugars 4x a day and they are: - am: 151-250 - 2h after b'fast: n/c - before lunch: 200-300s - 2h after lunch: n/c - before dinner: 230-290 - 2h after dinner: n/c - bedtime: 200-300s - nighttime: n/c Lowest sugar was 151; ? hypoglycemia awareness.  Highest sugar was 300s.  Glucometer: AccuChek Aviva  Pt's meals are: - Breakfast: skips - Lunch: skips - Dinner: meat and veggies - Snacks: one a day  - no CKD, last BUN/creatinine:  Lab Results  Component Value Date   BUN 8 08/20/2012   BUN 7 03/08/2012   CREATININE 0.53 08/20/2012   CREATININE 0.56 03/08/2012  On lisinopril 5 mg daily  -+ HL; last set of lipids: No results found for: CHOL, HDL, LDLCALC, LDLDIRECT, TRIG, CHOLHDL On atorvastatin 80.  - last eye exam was in 2020. No DR. Coming up in 04/2020. Needs surgery for "lazy eye".  Had sx before.  - no numbness and tingling in her feet.  Pt has FH of DM in mother and sister, also MGM and PGM, F - borderline.  She also has a history of R adrenal incidentaloma, discovered incidentally after she presented to the emergency room for constipation.  A right adrenal mass was seen on CT and she subsequently underwent ultrasound examination.    She actually had a separate referral from Dr. Rosendo Gros to be seen for this problem and rule out hormonal oversecretion.  I do not have records of her previous imaging tests, but the mass was approximately more than 4 cm (4.1 cm) and surgery is planned.  She does have diabetes, as mentioned above, but no paroxystic hypertensive crises, syncopal episodes, no new significant weight gain, no new infectious, no purple striae, no proximal muscle weakness.  She has a history of major depressive disorder (she mentions that her bipolar diagnosis was erroneous), history of cholecystectomy, multiple back surgeries, GERD.  ROS: Constitutional: + weight gain, + weight loss, + fatigue, + subjective hyperthermia, + subjective hypothermia, no nocturia Eyes: no blurry vision, no xerophthalmia ENT: no sore throat, no nodules palpated in neck, no dysphagia, no odynophagia, no hoarseness, no tinnitus, no hypoacusis Cardiovascular: no CP, no SOB, + palpitations, + leg swelling Respiratory: no cough, no SOB, no wheezing Gastrointestinal: + N, no V, no D, + + C, no acid reflux Musculoskeletal: + Muscle, + joint aches Skin: no rash, no hair loss, + easy bruising  Neurological: no tremors, no numbness or tingling/no dizziness/+ HAs Psychiatric: + Depression, + anxiety  Past Medical History:  Diagnosis Date  . Bipolar disorder ([place])   . Bulging disc   . Depression   . Diabetes mellitus   . Endometriosis   . GERD (gastroesophageal reflux disease)    Past Surgical History:  Procedure Laterality Date  . ABDOMINAL HYSTERECTOMY  08/25/2012   Procedure:  HYSTERECTOMY ABDOMINAL;  Surgeon: Gus Height, MD;  Location: [facility];  Service: Gynecology;  Laterality: N/A;  . CHOLECYSTECTOMY  1992  . ELBOW SURGERY     fix tendon on left arm  . low back surgery     x 3  . SALPINGOOPHORECTOMY  08/25/2012   Procedure: SALPINGO OOPHERECTOMY;  Surgeon: Gus Height, MD;  Location: [facility];  Service: Gynecology;  Laterality: Bilateral;   Social History   Socioeconomic History  . Marital status: Married    Spouse name: Not on file  . Number of children: 0, 1 adopted  . Years of education: Not on file  . Highest education level: Not on file  Occupational History  .  Disabled  Tobacco Use  . Smoking status: Never Smoker  . Smokeless tobacco: Never Used  Substance and Sexual Activity  . Alcohol use: No  . Drug use: No  . Sexual activity: Yes  Other Topics Concern  . Not on file  Social History Narrative  . Not on file   Social Determinants of Health   Financial Resource Strain:   . Difficulty of Paying Living Expenses: Not on file  Food Insecurity:   . Worried About Charity fundraiser in the Last Year: Not on file  . Ran Out of Food in the Last Year: Not on file  Transportation Needs:   . Lack of Transportation (Medical): Not on file  . Lack of Transportation (Non-Medical): Not on file  Physical Activity:   . Days of Exercise per Week: Not on file  . Minutes of Exercise per Session: Not on file  Stress:   . Feeling of Stress : Not on file  Social Connections:   . Frequency of Communication with Friends and Family: Not on file  . Frequency of Social Gatherings with Friends and Family: Not on file  . Attends Religious Services: Not on file  . Active Member of Clubs or Organizations: Not on file  . Attends Archivist Meetings: Not on file  . Marital Status: Not on file  Intimate Partner Violence:   . Fear of Current or Ex-Partner: Not on file  . Emotionally Abused: Not on file  . Physically Abused: Not on file  . Sexually  Abused: Not on file   Current Outpatient Medications on File Prior to Visit  Medication Sig Dispense Refill  . atorvastatin (LIPITOR) 80 MG tablet Take 80 mg by mouth daily.    Marland Kitchen docusate sodium (STOOL SOFTENER) 100 MG capsule Take 100 mg by mouth 4 (four) times daily.    Marland Kitchen estradiol (ESTRACE) 1 MG tablet Take 1 mg by mouth daily.    . fexofenadine (ALLEGRA) 60 MG tablet Take 60 mg by mouth daily.    . fluticasone (FLONASE) 50 MCG/ACT nasal spray Place 1 spray into both nostrils daily.    Marland Kitchen glimepiride (AMARYL) 4 MG tablet Take 4 mg by mouth 2 (two) times daily.    Marland Kitchen glipiZIDE (GLUCOTROL XL) 5 MG 24 hr tablet Take 2.5 mg by mouth daily.     . Glucose Blood (ONETOUCH  TEST VI) 1 each by Other route 4 (four) times daily.    Marland Kitchen HYDROcodone-acetaminophen (NORCO) 10-325 MG tablet Take 1 tablet by mouth every 12 (twelve) hours as needed.    . insulin detemir (LEVEMIR) 100 UNIT/ML injection Inject 60 Units into the skin at bedtime.    . insulin lispro (HUMALOG) 100 UNIT/ML injection Inject 10 Units into the skin 3 (three) times daily before meals.    Marland Kitchen lisinopril (ZESTRIL) 5 MG tablet Take 5 mg by mouth daily.    Glory Rosebush Delica Lancets [id number] MISC 1 each by Does not apply route 4 (four) times daily.    . pantoprazole (PROTONIX) 40 MG tablet Take 40 mg by mouth daily.    . pravastatin (PRAVACHOL) 40 MG tablet Take 40 mg by mouth daily.    . promethazine (PHENERGAN) 25 MG tablet Take 25 mg by mouth every 12 (twelve) hours as needed for nausea or vomiting.    . Saccharomyces boulardii (PROBIOTIC) 250 MG CAPS Take 1 tablet by mouth daily.    . Simethicone (GAS RELIEF) 180 MG CAPS Take 1 capsule by mouth daily.    . sitaGLIPtin (JANUVIA) 100 MG tablet Take 100 mg by mouth daily.    . traMADol (ULTRAM) 50 MG tablet Take 50 mg by mouth every 6 (six) hours as needed. pain     No current facility-administered medications on file prior to visit.   Allergies  Allergen Reactions  . Shellfish Allergy Hives  and Shortness Of Breath    Throat swelling  . Penicillins Hives   Family History  Problem Relation Age of Onset  . Anesthesia problems Neg Hx     PE: BP 110/64 (BP Location: Left Wrist, Patient Position: Sitting, Cuff Size: Large)   Pulse (!) 101   Ht 5\' 5"  (1.651 m)   Wt 219 lb 3.2 oz (99.4 kg)   SpO2 94%   BMI 36.48 kg/m  Wt Readings from Last 3 Encounters:  01/10/20 219 lb 3.2 oz (99.4 kg)  08/25/12 240 lb (108.9 kg)  08/20/12 241 lb (109.3 kg)   Constitutional: overweight, in NAD Eyes: PERRLA, EOMI, no exophthalmos ENT: moist mucous membranes, no thyromegaly, no cervical lymphadenopathy Cardiovascular: tachycardia, RR, No MRG Respiratory: CTA B Gastrointestinal: abdomen soft, NT, ND, BS+ Musculoskeletal: no deformities, strength intact in all 4 Skin: moist, warm, no rashes Neurological: no tremor with outstretched hands, DTR normal in all 4  ASSESSMENT: 1. DM2, insulin-dependent, uncontrolled, without long-term complications  2. R adrenal incidentaloma  PLAN:  1. Patient with long-standing, uncontrolled diabetes, on oral antidiabetic regimen + weekly GLP-1 receptor agonist + daily long-acting insulin, which became insufficient.  Latest HbA1c from 05/2019 was very high, at 9.9%. Today: 10.4%.  We discussed that we need to lower this for her to be able to have surgery for her adrenal adenoma. -Reviewing her sugars at home, they are slightly lower in the morning and increase significantly during the day, up to 300s.  She is on a high dose of her Levemir but she is taking Humalog based on the sliding scale, only if her sugars are high.  Since her sugars are high at all times when she checks, she takes between 6 and 10 units of sliding scale consistently.  She is having problems not being able to eat and she actually eats 1 meal a day, dinner, which leaves her uncovered with Humalog throughout the rest of the day.  Her sugars do not decrease if she does not eat, but they  actually increase. -At this visit, we discussed that she will need to check her sugars and use correction Humalog even if she does not eat.  Ideally, she would check every 4 hours but at least at the regular time of meals.  For the meals that she actually can eat, she needs to cover based on the size of the meal and also the sugars before the meal.  I gave her different regimens for small-regular/larger meals and also a different sliding scale.  We also did some exercises about how to calculate the dose of insulin that she needs with each meal. -We will not change the dose of Levemir.  At next visit, we can start taking off some of her medicines, for example glimepiride and Januvia, which I do not feel are helping much at this point. - I suggested to:  Patient Instructions  Please continue: - Glimepiride 8 mg before breakfast  - Januvia 100 mg before breakfast - Invokana 100 mg before breakfast  - Levemir 60 units at bedtime  Please change: - Lispro mealtime insulin 12 units before a smaller meal 14 units before a regular meal 16 units before a larger meal  - Lispro Sliding scale: 150-199: + 2 units 200-250: + 4 units 251-300: + 6 units >300: + 8 units  If you cannot eat, check sugars at the time of the meals and correct with sliding scale.  Please return in 1.5 months with your sugar log.   - Strongly advised her to start checking sugars at different times of the day - check 3-4x a day, rotating checks - discussed about CBG targets for treatment: 80-130 mg/dL before meals and <180 mg/dL after meals; target HbA1c <7%. - given sugar log and advised how to fill it and to bring it at next appt  - given foot care handout and explained the principles  - given instructions for hypoglycemia management "15-15 rule"  - advised for yearly eye exams  - Return to clinic in 3 mo with sugar log   2. R adrenal incidentaloma - Unfortunately, I do not have imaging tests to review but, per review  of Dr. Johney Frame notes, this measures more than 4 cm and right adrenalectomy is planned. - We discussed about the fact that there are 3 possible scenarios: - A nonfunctioning adrenal nodule - A functioning adrenal adenoma - which can hypersecrete catecholamines/metanephrines, cortisol, or aldosterone - Adrenal cancer/metastasis We do not have blood tests to check for cancer, but the best indicator is lack of change in size and appearance over time.  - To differentiate between a functioning and a nonfunctioning adrenal nodule, we'll need to rule out hypersecretion by checking the following tests  - dexamethasone suppression test to rule out Cushing syndrome (6% of adrenal incidentalomas) - If the cortisol level returns >5, will need 24h urine free cortisol - Plasma fractionated metanephrines and catecholamines to rule out pheochromocytoma (3% of adrenal incidentalomas) - Plasma renin activity and aldosterone level to rule out primary hyperaldosteronism (0.6% of adrenal incidentalomas) - I ordered the above tests today. I advised pt to take the dexamethasone tablets (1 mg total dose, sent to pharmacy) at 11 PM the night before coming to the lab to have a cortisol level drawn. I also added dexamethasone level. - After surgery, I will need to see her to check her adrenal function. - I explained all the above to the patient, and she agrees with the plan.  Orders Placed This Encounter  Procedures  .  Catecholamines, fractionated, plasma  . Aldosterone + renin activity w/ ratio  . Metanephrines, plasma  . Potassium  . Cortisol-am, blood  . Dexamethasone, blood  . POCT HgB A1C   Component     Latest Ref Rng & Units 01/10/2020  Epinephrine     pg/mL <20  Norepinephrine     pg/mL 650  Dopamine     pg/mL 19  Total Catecholamines     pg/mL 669  ALDOSTERONE     * ng/dL 6  Renin Activity     0.25 - 5.82 ng/mL/h 6.79 (H)  ALDO / PRA Ratio     0.9 - 28.9 Ratio 0.9  Metanephrine, Pl     <=57  pg/mL <25  Normetanephrine, Pl     <=148 pg/mL 111  Total Metanephrines-Plasma     <=205 pg/mL 111  Potassium     3.5 - 5.1 mEq/L 4.0   Component     Latest Ref Rng & Units 02/03/2020  Cortisol - AM     mcg/dL 2.3 (L)   No signs of pheochromocytoma or hyperaldosteronism.  Also, dexamethasone suppression test normal (cortisol <2.5), so no signs of Cushing syndrome.  We will forward the results to Dr. Rosendo Gros.  Philemon Kingdom, MD PhD [facility] Endocr[facility]

## 2020-01-10 NOTE — Patient Instructions (Signed)
Please continue: - Glimepiride 8 mg before breakfast  - Januvia 100 mg before breakfast - Invokana 100 mg before breakfast  - Levemir 60 units at bedtime  Please change: - Lispro mealtime insulin 12 units before a smaller meal 14 units before a regular meal 16 units before a larger meal  - Lispro Sliding scale: 150-199: + 2 units 200-250: + 4 units 251-300: + 6 units >300: + 8 units  If you cannot eat, check sugars at the time of the meals and correct with sliding scale.  Please return in 1.5 months with your sugar log.   PATIENT INSTRUCTIONS FOR TYPE 2 DIABETES:  **Please join MyChart!** - see attached instructions about how to join if you have not done so already.  DIET AND EXERCISE Diet and exercise is an important part of diabetic treatment.  We recommended aerobic exercise in the form of brisk walking (working between 40-60% of maximal aerobic capacity, similar to brisk walking) for 150 minutes per week (such as 30 minutes five days per week) along with 3 times per week performing 'resistance' training (using various gauge rubber tubes with handles) 5-10 exercises involving the major muscle groups (upper body, lower body and core) performing 10-15 repetitions (or near fatigue) each exercise. Start at half the above goal but build slowly to reach the above goals. If limited by weight, joint pain, or disability, we recommend daily walking in a swimming pool with water up to waist to reduce pressure from joints while allow for adequate exercise.    BLOOD GLUCOSES Monitoring your blood glucoses is important for continued management of your diabetes. Please check your blood glucoses 2-4 times a day: fasting, before meals and at bedtime (you can rotate these measurements - e.g. one day check before the 3 meals, the next day check before 2 of the meals and before bedtime, etc.).   HYPOGLYCEMIA (low blood sugar) Hypoglycemia is usually a reaction to not eating, exercising, or taking  too much insulin/ other diabetes drugs.  Symptoms include tremors, sweating, hunger, confusion, headache, etc. Treat IMMEDIATELY with 15 grams of Carbs: . 4 glucose tablets .  cup regular juice/soda . 2 tablespoons raisins . 4 teaspoons sugar . 1 tablespoon honey Recheck blood glucose in 15 mins and repeat above if still symptomatic/blood glucose <100.  RECOMMENDATIONS TO REDUCE YOUR RISK OF DIABETIC COMPLICATIONS: * Take your prescribed MEDICATION(S) * Follow a DIABETIC diet: Complex carbs, fiber rich foods, (monounsaturated and polyunsaturated) fats * AVOID saturated/trans fats, high fat foods, >2,300 mg salt per day. * EXERCISE at least 5 times a week for 30 minutes or preferably daily.  * DO NOT SMOKE OR DRINK more than 1 drink a day. * Check your FEET every day. Do not wear tightfitting shoes. Contact us if you develop an ulcer * See your EYE doctor once a year or more if needed * Get a FLU shot once a year * Get a PNEUMONIA vaccine once before and once after age 65 years  GOALS:  * Your Hemoglobin A1c of <7%  * fasting sugars need to be <130 * after meals sugars need to be <180 (2h after you start eating) * Your Systolic BP should be [id number] or lower  * Your Diastolic BP should be 80 or lower  * Your HDL (Good Cholesterol) should be 40 or higher  * Your LDL (Bad Cholesterol) should be 100 or lower. * Your Triglycerides should be 150 or lower  * Your Urine microalbumin (kidney function) should be <  43 * Your Body Mass Index should be 25 or lower    Please consider the following ways to cut down carbs and fat and increase fiber and micronutrients in your diet: - substitute whole grain for white bread or pasta - substitute brown rice for white rice - substitute 90-calorie flat bread pieces for slices of bread when possible - substitute sweet potatoes or yams for white potatoes - substitute humus for margarine - substitute tofu for cheese when possible - substitute almond or  rice milk for regular milk (would not drink soy milk daily due to concern for soy estrogen influence on breast cancer risk) - substitute dark chocolate for other sweets when possible - substitute water - can add lemon or orange slices for taste - for diet sodas (artificial sweeteners will trick your body that you can eat sweets without getting calories and will lead you to overeating and weight gain in the long run) - do not skip breakfast or other meals (this will slow down the metabolism and will result in more weight gain over time)  - can try smoothies made from fruit and almond/rice milk in am instead of regular breakfast - can also try old-fashioned (not instant) oatmeal made with almond/rice milk in am - order the dressing on the side when eating salad at a restaurant (pour less than half of the dressing on the salad) - eat as little meat as possible - can try juicing, but should not forget that juicing will get rid of the fiber, so would alternate with eating raw veg./fruits or drinking smoothies - use as little oil as possible, even when using olive oil - can dress a salad with a mix of balsamic vinegar and lemon juice, for e.g. - use agave nectar, stevia sugar, or regular sugar rather than artificial sweateners - steam or broil/roast veggies  - snack on veggies/fruit/nuts (unsalted, preferably) when possible, rather than processed foods - reduce or eliminate aspartame in diet (it is in diet sodas, chewing gum, etc) Read the labels!  Try to read Dr. Janene Harvey book: "Program for Reversing Diabetes" for other ideas for healthy eating.

## 2020-01-18 LAB — CATECHOLAMINES, FRACTIONATED, PLASMA
Dopamine: 19 pg/mL
Epinephrine: <20 pg/mL
Norepinephrine: 650 pg/mL
Total Catecholamines: 669 pg/mL

## 2020-01-18 LAB — METANEPHRINES, PLASMA
Metanephrine, Free: <25 pg/mL (ref <=57)
Normetanephrine, Free: 111 pg/mL (ref <=148)
Total Metanephrines-Plasma: 111 pg/mL (ref <=205)

## 2020-01-18 LAB — ALDOSTERONE + RENIN ACTIVITY W/ RATIO
ALDO / PRA Ratio: 0.9 Ratio (ref 0.9–28.9)
Aldosterone: 6 ng/dL
Renin Activity: 6.79 ng/mL/h — ABNORMAL HIGH (ref 0.25–5.82)

## 2020-01-19 ENCOUNTER — Telehealth: Payer: Self-pay

## 2020-01-19 NOTE — Telephone Encounter (Signed)
-----   Message from Philemon Kingdom, MD sent at 01/18/2020  4:44 PM EST ----- [name], can you please call pt:  Valerie Diaz adrenal labs are normal, but I am still waiting for the cortisol level to return... I am not sure if she actually return for this to be done in the morning, after taking Valerie Diaz dexamethasone at night.

## 2020-01-24 NOTE — Telephone Encounter (Signed)
Spoke to patient, she is coming back at the end of the month for the remaninig labs.

## 2020-01-24 NOTE — Telephone Encounter (Signed)
Patient returned call. Patient requests to be called at ph# [phone] to be given lab results.

## 2020-01-24 NOTE — Telephone Encounter (Signed)
Left message for patient to return our call at [phone].  

## 2020-02-03 ENCOUNTER — Other Ambulatory Visit: Payer: Medicare Other

## 2020-02-03 ENCOUNTER — Other Ambulatory Visit: Payer: Self-pay

## 2020-02-03 DIAGNOSIS — D3501 Benign neoplasm of right adrenal gland: Secondary | ICD-10-CM

## 2020-02-11 LAB — CORTISOL-AM, BLOOD: Cortisol - AM: 2.3 ug/dL — ABNORMAL LOW

## 2020-02-11 LAB — DEXAMETHASONE, BLOOD: Dexamethasone, Serum: 272 ng/dL

## 2020-02-17 ENCOUNTER — Ambulatory Visit: Payer: Self-pay | Admitting: General Surgery

## 2020-02-17 NOTE — H&P (Signed)
History of Present Illness Ralene Ok MD; 02/17/2020 10:36 AM) The patient is a 54 year old female who presents wtih an adrenal mass. Patient is a 54 year old female who follows back up today secondary to right renal mass. Patient recently visited with Dr. 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 This was negative for pheochromocytoma, Conn syndrome, indications syndrome.  Patient states she's down to approximately 10 cigarettes today. Patient has been working on her blood sugars as per Dr.Gherge patient does have a history of an open cholecystectomy or possibly 30 years ago.     --------------------------------- Patient is a 54 year old female who is referred by Dr. 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 Patient was recently in the ER at outside hospital for constipation. She underwent CT scan and was found to have a right-sided adrenal mass. Subsequent patient underwent ultrasound. I did not have the studies currently have these are from an outside hospital. We'll work on obtaining the studies I can visualize the mass.  Patient does state that she has issues with tachycardia. Patient recently underwent workup for tachycardia was not found to have a reason for tachycardia. She states that her heart rate is elevated at 130+. She monitors this at home. She states that she is only on lisinopril and has not had any significant hypertension issues.  Patient does have a history of lung cancer and pancreaticc ancer history in her grandparents.  Patient has had a previous open cholecystectomy, multiple back surgeries.  Patient states that she smokes approximately half pack a day of cigarettes.   Allergies (Tanisha A. Owens Shark, [place]; 02/17/2020 10:12 AM) Penicillin G Benzathine  & Proc *PENICILLINS*  Shellfish  Allergies Reconciled   Medication History (Tanisha A. Owens Shark, RMA; 02/17/2020 10:13 AM) HYDROcodone-Acetaminophen (10-325MG  Tablet, Oral) Active. Levemir FlexTouch (100UNIT/ML Soln Pen-inj, Subcutaneous) Active. Lisinopril (5MG  Tablet, Oral) Active. Estradiol (1MG  Tablet, Oral) Active. Atorvastatin Calcium (80MG  Tablet, Oral) Active. Glimepiride (4MG  Tablet, Oral) Active. Januvia (100MG  Tablet, Oral) Active. Fexofenadine HCl (Oral) Specific strength unknown - Active. Promethazine HCl (25MG  Tablet, Oral) Active. Fluticasone Propionate (50MCG/ACT Suspension, Nasal) Active. Insulin Lispro (100UNIT/ML Soln Cartridge, Subcutaneous) Active. Probiotic (250MG  Capsule, Oral) Active. Stool Softener (100MG  Tablet, Oral) Active. Gas Relief (180MG  Capsule, Oral) Active. Medications Reconciled    Review of Systems Ralene Ok, MD; 02/17/2020 10:38 AM) General Present- Appetite Loss, Fatigue, Night Sweats and Weight Loss. Not Present- Chills, Fever and Weight Gain. Skin Present- Dryness, New Lesions and Non-Healing Wounds. Not Present- Change in Wart/Mole, Hives, Jaundice, Rash and Ulcer. HEENT Present- Seasonal Allergies, Sinus Pain and Wears glasses/contact lenses. Not Present- Earache, Hearing Loss, Hoarseness, Nose Bleed, Oral Ulcers, Ringing in the Ears, Sore Throat, Visual Disturbances and Yellow Eyes. Breast Present- Nipple Discharge and Skin Changes. Not Present- Breast Mass and Breast Pain. Cardiovascular Present- Rapid Heart Rate and Swelling of Extremities. Not Present- Chest Pain, Difficulty Breathing Lying Down, Leg Cramps, Palpitations and Shortness of Breath. Gastrointestinal Present- Bloating, Change in Bowel Habits, Constipation, Excessive gas, Gets full quickly at meals, Indigestion and Nausea. Not Present- Abdominal Pain, Bloody Stool, Chronic diarrhea, Difficulty Swallowing, Hemorrhoids, Rectal Pain and Vomiting. Female  Genitourinary Present- Frequency, Nocturia and Urgency. Not Present- Painful Urination and Pelvic Pain. Musculoskeletal Present- Back Pain, Joint Pain, Joint Stiffness and Swelling of Extremities. Not Present- Muscle Pain and Muscle Weakness. Neurological Present- Headaches and  Weakness. Not Present- Decreased Memory, Fainting, Numbness, Seizures, Tingling, Tremor and Trouble walking. Psychiatric Present- Anxiety, Change in Sleep Pattern and Frequent crying. Not Present- Bipolar, Depression and Fearful. Endocrine Present- Cold Intolerance, Heat Intolerance, Hot flashes and New Diabetes. Not Present- Excessive Hunger and Hair Changes. Hematology Present- Easy Bruising. Not Present- Blood Thinners, Excessive bleeding, Gland problems, HIV and Persistent Infections.  Vitals (Tanisha A. Brown RMA; 02/17/2020 10:13 AM) 02/17/2020 10:13 AM Weight: 217.4 lb Height: 65in Body Surface Area: 2.05 m Body Mass Index: 36.18 kg/m  Temp.: 97.80F  Pulse: 117 (Regular)  BP: 134/84 (Sitting, Left Arm, Standard)       Physical Exam Ralene Ok, MD; 02/17/2020 10:38 AM) The physical exam findings are as follows: Note: Constitutional: No acute distress, conversant, appears stated age  Eyes: Anicteric sclerae, moist conjunctiva, no lid lag  Neck: No thyromegaly, trachea midline, no cervical lymphadenopathy  Lungs: Clear to auscultation biilaterally, normal respiratory effot  Cardiovascular: regular rate & rhythm, no murmurs, no peripheal edema, pedal pulses 2+  GI: Soft, no masses or hepatosplenomegaly, non-tender to palpation  MSK: Normal gait, no clubbing cyanosis, edema  Skin: No rashes, palpation reveals normal skin turgor  Psychiatric: Appropriate judgment and insight, oriented to person, place, and time    Assessment & Plan Ralene Ok MD; 02/17/2020 10:37 AM) 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 1. We'll proceed to the operating room for a right robotic adrenalectomy. 2. I discussed with patient the risks and benefits of the procedure to include but not limited to: Infection, bleeding, damage to structures, possible need further surgery. The patient was understanding and wished to proceed.  I reviewed the patient's external notes from the referring physicians as well as consulting physician team. Each of the radiologic studies and lab studies were independently reviewed and interpreted. I discussed the results of the above studies and how they relate to the patient's surgical problems.

## 2020-02-21 ENCOUNTER — Ambulatory Visit: Payer: Medicare Other | Admitting: Internal Medicine

## 2020-03-21 ENCOUNTER — Encounter (HOSPITAL_COMMUNITY): Payer: Self-pay

## 2020-03-21 NOTE — Patient Instructions (Addendum)
DUE TO COVID-19 ONLY TWO VISITORS ARE ALLOWED TO COME WITH YOU AND STAY IN THE WAITING ROOM ONLY DURING PRE OP AND PROCEDURE. THE TWO VISITORS MAY VISIT WITH YOU IN YOUR PRIVATE ROOM DURING VISITING HOURS ONLY!!   COVID SWAB TESTING MUST BE COMPLETED ON:  Tuesday, April 04, 2020 at 1:45 PM   [address] [place] -[facility] enter pre surgical testing line (Must self quarantine after testing. Follow instructions on handout.)             Your procedure is scheduled on: Friday, April 07, 2020   Report to [facility] Main  Entrance    Report to admitting at 12:30 PM   Call this number if you have problems the morning of surgery [phone]   Do not eat food  :After Midnight.   May have liquids until 8:30 AM day of surgery   CLEAR LIQUID DIET  Foods Allowed                                                                     Foods Excluded  Water, Black Coffee and tea, regular and decaf                             liquids that you cannot  Plain Jell-O in any flavor  (No red)                                           see through such as: Fruit ices (not with fruit pulp)                                     milk, soups, orange juice  Iced Popsicles (No red)                                    All solid food Carbonated beverages, regular and diet                                    Apple juices Sports drinks like Gatorade (No red) Lightly seasoned clear broth or consume(fat free) Sugar, honey syrup  Sample Menu Breakfast                                Lunch                                     Supper Cranberry juice                    Beef broth                            Chicken  broth Jell-O                                     Grape juice                           Apple juice Coffee or tea                        Jell-O                                      Popsicle                                                Coffee or tea                        Coffee  or tea   Oral Hygiene is also important to reduce your risk of infection.                                    Remember - BRUSH YOUR TEETH THE MORNING OF SURGERY WITH YOUR REGULAR TOOTHPASTE   Do NOT smoke after Midnight   Take only the morning  Glimepiride the day before surgery   Take your usual Humalog (Lispro) mealtime doses the day before surgery   Take Levemir 25 units at bedtime the night before surgery   Take these medicines the morning of surgery with A SIP OF WATER: Atorvastatin, Estradiol, Omeprazole, Stool softner   May use Flonase day of surgery   Take Levemir 10 units the morning of surgery  DO NOT TAKE ANY ORAL DIABETIC MEDICATIONS DAY OF YOUR SURGERY                               You may not have any metal on your body including hair pins, jewelry, and body piercings             Do not wear make-up, lotions, powders, perfumes/cologne, or deodorant             Do not wear nail polish.  Do not shave  48 hours prior to surgery.              Do not bring valuables to the hospital. [place].   Contacts, dentures or bridgework may not be worn into surgery.   Bring small overnight bag day of surgery.   Special Instructions: Bring a copy of your healthcare power of attorney and living will documents         the day of surgery if you haven't scanned them in before.              Please read over the following fact sheets you were given: [place] [phone]  How to Manage Your Diabetes Before and After  Surgery  Why is it important to control my blood sugar before and after surgery? . Improving blood sugar levels before and after surgery helps healing and can limit problems. . A way of improving blood sugar control is eating a healthy diet by: o  Eating less sugar and carbohydrates o  Increasing activity/exercise o  Talking with your doctor about reaching your  blood sugar goals . High blood sugars (greater than 180 mg/dL) can raise your risk of infections and slow your recovery, so you will need to focus on controlling your diabetes during the weeks before surgery. . Make sure that the doctor who takes care of your diabetes knows about your planned surgery including the date and location.  How do I manage my blood sugar before surgery? . Check your blood sugar at least 4 times a day, starting 2 days before surgery, to make sure that the level is not too high or low. o Check your blood sugar the morning of your surgery when you wake up and every 2 hours until you get to the Short Stay unit. . If your blood sugar is less than 70 mg/dL, you will need to treat for low blood sugar: o Do not take insulin. o Treat a low blood sugar (less than 70 mg/dL) with  cup of clear juice (cranberry or apple), 4 glucose tablets, OR glucose gel. o Recheck blood sugar in 15 minutes after treatment (to make sure it is greater than 70 mg/dL). If your blood sugar is not greater than 70 mg/dL on recheck, call [phone] for further instructions. . Report your blood sugar to the short stay nurse when you get to Short Stay.  . If you are admitted to the hospital after surgery: o Your blood sugar will be checked by the staff and you will probably be given insulin after surgery (instead of oral diabetes medicines) to make sure you have good blood sugar levels. o The goal for blood sugar control after surgery is 80-180 mg/dL.   Reviewed and Endorsed by [facility] Patient Education Committee, August 2015   [facility] - Preparing for Surgery Before surgery, you can play an important role.  Because skin is not sterile, your skin needs to be as free of germs as possible.  You can reduce the number of germs on your skin by washing with CHG (chlorahexidine gluconate) soap before surgery.  CHG is an antiseptic cleaner which kills germs and bonds with the skin to continue killing  germs even after washing. Please DO NOT use if you have an allergy to CHG or antibacterial soaps.  If your skin becomes reddened/irritated stop using the CHG and inform your nurse when you arrive at Short Stay. Do not shave (including legs and underarms) for at least 48 hours prior to the first CHG shower.  You may shave your face/neck.  Please follow these instructions carefully:  1.  Shower with CHG Soap the night before surgery and the  morning of surgery.  2.  If you choose to wash your hair, wash your hair first as usual with your normal  shampoo.  3.  After you shampoo, rinse your hair and body thoroughly to remove the shampoo.                             4.  Use CHG as you would any other liquid soap.  You can apply chg directly to the skin and wash.  Gently with a scrungie or clean washcloth.  5.  Apply the CHG Soap to your body ONLY FROM THE NECK DOWN.   Do   not use on face/ open                           Wound or open sores. Avoid contact with eyes, ears mouth and   genitals (private parts).                       Wash face,  Genitals (private parts) with your normal soap.             6.  Wash thoroughly, paying special attention to the area where your    surgery  will be performed.  7.  Thoroughly rinse your body with warm water from the neck down.  8.  DO NOT shower/wash with your normal soap after using and rinsing off the CHG Soap.                9.  Pat yourself dry with a clean towel.            10.  Wear clean pajamas.            11.  Place clean sheets on your bed the night of your first shower and do not  sleep with pets. Day of Surgery : Do not apply any lotions/deodorants the morning of surgery.  Please wear clean clothes to the hospital/surgery center.  FAILURE TO FOLLOW THESE INSTRUCTIONS MAY RESULT IN THE CANCELLATION OF YOUR SURGERY  PATIENT SIGNATURE_________________________________  NURSE  SIGNATURE__________________________________  ________________________________________________________________________

## 2020-03-22 ENCOUNTER — Other Ambulatory Visit: Payer: Self-pay

## 2020-03-24 ENCOUNTER — Ambulatory Visit (INDEPENDENT_AMBULATORY_CARE_PROVIDER_SITE_OTHER): Payer: Medicare Other | Admitting: Internal Medicine

## 2020-03-24 ENCOUNTER — Other Ambulatory Visit: Payer: Self-pay

## 2020-03-24 ENCOUNTER — Encounter: Payer: Self-pay | Admitting: Internal Medicine

## 2020-03-24 VITALS — BP 140/70 | HR 92 | Ht 65.0 in | Wt 221.0 lb

## 2020-03-24 DIAGNOSIS — Z794 Long term (current) use of insulin: Secondary | ICD-10-CM

## 2020-03-24 DIAGNOSIS — D3501 Benign neoplasm of right adrenal gland: Secondary | ICD-10-CM

## 2020-03-24 DIAGNOSIS — E1165 Type 2 diabetes mellitus with hyperglycemia: Secondary | ICD-10-CM

## 2020-03-24 LAB — POCT GLYCOSYLATED HEMOGLOBIN (HGB A1C): Hemoglobin A1C: 9.7 % — AB (ref 4.0–5.6)

## 2020-03-24 MED ORDER — OZEMPIC (0.25 OR 0.5 MG/DOSE) 2 MG/1.5ML ~~LOC~~ SOPN: 0.5000 mg | PEN_INJECTOR | SUBCUTANEOUS | 5 refills | Status: DC

## 2020-03-24 NOTE — Progress Notes (Signed)
Patient ID: Valerie Diaz, female   DOB: [birth year], 54 y.o.   MRN: [id number]   This visit occurred during the SARS-CoV-2 public health emergency.  Safety protocols were in place, including screening questions prior to the visit, additional usage of staff PPE, and extensive cleaning of exam room while observing appropriate contact time as indicated for disinfecting solutions.   HPI: Valerie Diaz is a 54 y.o.-year-old female, initially referred by her PCP, Dr. York Ram, for management of DM2, dx in her 69s, insulin-dependent since 08/2019, uncontrolled, without long-term complications.  She had a separate referral from Dr. Ralene Ok for R adrenal incidentaloma.  Last visit 2.5 months ago.  DM2:  Reviewed HbA1c levels: Lab Results  Component Value Date   HGBA1C 10.4 (A) 01/10/2020  05/31/2019: HbA1c 9.9%  Pt is on a regimen of: - Glimepiride 8 mg before breakfast  - Januvia 100 mg before breakfast - Levemir 35 >> 60 units at bedtime - Lispro: 12 units before a smaller meal 14 units before a regular meal 16 units before a larger meal - Lispro Sliding scale: 150-199: + 2 units 200-250: + 4 units 251-300: + 6 units >300: + 8 units If you cannot eat, check sugars at the time of the meals and correct with sliding scale.  She was on Trulicity and Ozempic >> neck swelling, nausea, vomiting.  Pt checks her sugars 6-8 times a day and brings a very detailed, excellent, log: - am: 151-250 >> 140-287, 330, 375 - 2h after b'fast: n/c >> 142-279 - before lunch: 200-300s >> 121-280, 325 - 2h after lunch: n/c >> 172-247 - before dinner: 230-290 >> 131-255, 304 - 2h after dinner: n/c >> 73, 145-270 - bedtime: 200-300s >> 151-310 - nighttime: n/c Lowest sugar was 151 >> 73; ? hypoglycemia awareness.  Highest sugar was 300s >> 300s.  Glucometer: AccuChek [employer]iva  Pt's meals are: - Breakfast: Sometimes skips - Lunch: Eats  more frequently now - Dinner: meat and veggies -  Snacks: one a day  -No CKD, last BUN/creatinine:  Lab Results  Component Value Date   BUN 8 08/20/2012   BUN 7 03/08/2012   CREATININE 0.53 08/20/2012   CREATININE 0.56 03/08/2012  On lisinopril 5 mg  -+ HL; last set of lipids: No results found for: CHOL, HDL, LDLCALC, LDLDIRECT, TRIG, CHOLHDL On atorvastatin 80.  - last eye exam was in 2020: No DR. She has a new one coming up in 04/2020. Needs surgery for "lazy eye". Had sx before.  -No numbness and tingling in her feet.  Pt has FH of DM in mother and sister, also MGM and PGM, F - borderline.  R adrenal incidentaloma -Discovered incidentally after she presented to the emergency room for constipation.  A right adrenal mass was seen on CT and she subsequently underwent ultrasound examination.    At last visit, we ruled out adrenal hormone hypersecretion (no signs of pheochromocytoma, primary hyperaldosteronism, or Cushing's syndrome):  Component     Latest Ref Rng & Units 01/10/2020  Epinephrine     pg/mL <20  Norepinephrine     pg/mL 650  Dopamine     pg/mL 19  Total Catecholamines     pg/mL 669  ALDOSTERONE     * ng/dL 6  Renin Activity     0.25 - 5.82 ng/mL/h 6.79 (H)  ALDO / PRA Ratio     0.9 - 28.9 Ratio 0.9  Metanephrine, Pl     <=57 pg/mL <25  Normetanephrine,  Pl     <=148 pg/mL 111  Total Metanephrines-Plasma     <=205 pg/mL 111  Potassium     3.5 - 5.1 mEq/L 4.0   Component     Latest Ref Rng & Units 02/03/2020  Cortisol - AM     mcg/dL 2.3 (L)   I do not have records of her previous imaging tests, but the mass was approximately more than 4 cm (4.1 cm) and surgery is planned.  She will have this on 04/07/2020 with Dr. Rosendo Gros.  She does have a history of DM2, as mentioned above, but no paroxystic hypertensive crises, syncopal episodes, no new significant weight gain, no new infectious, no purple striae, no proximal muscle weakness.  She has a history of MDD (she mentions that her bipolar diagnosis was  erroneous), history of cholecystectomy, multiple back surgeries, GERD.  ROS: Constitutional: no weight gain/no weight loss, + fatigue, + subjective hyperthermia, + subjective hypothermia Eyes: no blurry vision, no xerophthalmia ENT: no sore throat, no nodules palpated in neck, no dysphagia, no odynophagia, no hoarseness Cardiovascular: no CP/no SOB/no palpitations/+ leg swelling Respiratory: no cough/no SOB/no wheezing Gastrointestinal: no N/no V/no D/+ C/no acid reflux Musculoskeletal: + Muscle aches/+ joint aches Skin: no rashes, no hair loss Neurological: no tremors/no numbness/no tingling/no dizziness  I reviewed pt's medications, allergies, PMH, social hx, family hx, and changes were documented in the history of present illness. Otherwise, unchanged from my initial visit note.  Past Medical History:  Diagnosis Date  . Bipolar disorder ([place])   . Bulging disc   . Depression   . Diabetes mellitus   . Endometriosis   . GERD (gastroesophageal reflux disease)    Past Surgical History:  Procedure Laterality Date  . ABDOMINAL HYSTERECTOMY  08/25/2012   Procedure: HYSTERECTOMY ABDOMINAL;  Surgeon: Gus Height, MD;  Location: [facility];  Service: Gynecology;  Laterality: N/A;  . CHOLECYSTECTOMY  1992  . ELBOW SURGERY     fix tendon on left arm  . low back surgery     x 3  . SALPINGOOPHORECTOMY  08/25/2012   Procedure: SALPINGO OOPHERECTOMY;  Surgeon: Gus Height, MD;  Location: [facility];  Service: Gynecology;  Laterality: Bilateral;   Social History   Socioeconomic History  . Marital status: Married    Spouse name: Not on file  . Number of children: 0, 1 adopted  . Years of education: Not on file  . Highest education level: Not on file  Occupational History  .  Disabled  Tobacco Use  . Smoking status: Never Smoker  . Smokeless tobacco: Never Used  Substance and Sexual Activity  . Alcohol use: No  . Drug use: No  . Sexual activity: Yes  Other Topics Concern  . Not on file   Social History Narrative  . Not on file   Social Determinants of Health   Financial Resource Strain:   . Difficulty of Paying Living Expenses: Not on file  Food Insecurity:   . Worried About Charity fundraiser in the Last Year: Not on file  . Ran Out of Food in the Last Year: Not on file  Transportation Needs:   . Lack of Transportation (Medical): Not on file  . Lack of Transportation (Non-Medical): Not on file  Physical Activity:   . Days of Exercise per Week: Not on file  . Minutes of Exercise per Session: Not on file  Stress:   . Feeling of Stress : Not on file  Social Connections:   .  Frequency of Communication with Friends and Family: Not on file  . Frequency of Social Gatherings with Friends and Family: Not on file  . Attends Religious Services: Not on file  . Active Member of Clubs or Organizations: Not on file  . Attends Archivist Meetings: Not on file  . Marital Status: Not on file  Intimate Partner Violence:   . Fear of Current or Ex-Partner: Not on file  . Emotionally Abused: Not on file  . Physically Abused: Not on file  . Sexually Abused: Not on file   Current Outpatient Medications on File Prior to Visit  Medication Sig Dispense Refill  . atorvastatin (LIPITOR) 80 MG tablet Take 80 mg by mouth daily.    Marland Kitchen docusate sodium (STOOL SOFTENER) 100 MG capsule Take 100 mg by mouth 4 (four) times daily.    Marland Kitchen estradiol (ESTRACE) 1 MG tablet Take 1 mg by mouth daily.    . fluticasone (FLONASE) 50 MCG/ACT nasal spray Place 1 spray into both nostrils daily.    Marland Kitchen glimepiride (AMARYL) 4 MG tablet Take 8 mg by mouth daily with breakfast.     . Glucose Blood (ONETOUCH TEST VI) 1 each by Other route 4 (four) times daily.    Marland Kitchen HYDROcodone-acetaminophen (NORCO) 10-325 MG tablet Take 1 tablet by mouth every 12 (twelve) hours as needed for severe pain.     Marland Kitchen insulin detemir (LEVEMIR) 100 UNIT/ML injection Inject 60 Units into the skin at bedtime.    . insulin lispro  (HUMALOG) 100 UNIT/ML injection Inject 12 Units into the skin 3 (three) times daily before meals. Sliding scale    . lisinopril (ZESTRIL) 5 MG tablet Take 5 mg by mouth daily.    Marland Kitchen omeprazole (PRILOSEC) 40 MG capsule Take 40 mg by mouth daily.    Glory Rosebush Delica Lancets [id number] MISC 1 each by Does not apply route 4 (four) times daily.    . promethazine (PHENERGAN) 25 MG tablet Take 25 mg by mouth every 12 (twelve) hours as needed for nausea or vomiting.    . Saccharomyces boulardii (PROBIOTIC) 250 MG CAPS Take 1 tablet by mouth daily.    . Simethicone (GAS RELIEF) 180 MG CAPS Take 1 capsule by mouth daily.    . sitaGLIPtin (JANUVIA) 100 MG tablet Take 100 mg by mouth daily.     No current facility-administered medications on file prior to visit.   Allergies  Allergen Reactions  . Penicillins Anaphylaxis and Hives    Did it involve swelling of the face/tongue/throat, SOB, or low BP? yes Did it involve sudden or severe rash/hives, skin peeling, or any reaction on the inside of your mouth or nose? Yes Did you need to seek medical attention at a hospital or doctor's office? Yes When did it last happen?childhood  If all above answers are "NO", may proceed with cephalosporin use.   . Shellfish Allergy Hives and Shortness Of Breath    Throat swelling   Family History  Problem Relation Age of Onset  . Anesthesia problems Neg Hx     PE: BP 140/70   Pulse 92   Ht 5\' 5"  (1.651 m)   Wt 221 lb (100.2 kg)   SpO2 95%   BMI 36.78 kg/m  Wt Readings from Last 3 Encounters:  03/24/20 221 lb (100.2 kg)  01/10/20 219 lb 3.2 oz (99.4 kg)  08/25/12 240 lb (108.9 kg)   Constitutional: overweight, in NAD Eyes: PERRLA, EOMI, no exophthalmos ENT: moist mucous membranes, no thyromegaly,  no cervical lymphadenopathy Cardiovascular: RRR, No MRG Respiratory: CTA B Gastrointestinal: abdomen soft, NT, ND, BS+ Musculoskeletal: no deformities, strength intact in all 4 Skin: moist, warm, no  rashes Neurological: no tremor with outstretched hands, DTR normal in all 4  ASSESSMENT: 1. DM2, insulin-dependent, uncontrolled, without long-term complications  2. R adrenal incidentaloma  PLAN:  1. Patient with longstanding, uncontrolled, type 2 diabetes, on a complex medication regimen with oral medications (sulfonylurea, DPP 4 inhibitor, SGLT2 inhibitor) and a basal-bolus insulin regimen.  At last visit, HbA1c was 10.4%, very high, and we increased her insulin lispro doses at last visit, to hopefully improve her sugars enough for her upcoming right adrenal surgery.  The sugars at that time are slightly lower in the morning and they were significantly increasing during the day, up to 300s.  We also discussed that if she did not eat she still needs to take correction lispro insulin.  We did discuss that if the sugars improved significantly to stop glimepiride and maybe even Januvia soon. -At this visit, sugars are still high.We are pressured to improve her sugars as fast as possible before her surgery. She does bring a very good blood sugar log in which documents not only her sugars before and after every meal but also the lispro doses.  We reviewed these records together.  There is no consistent pattern, but it appears that sugars before meals are high and they do not increase necessarily after meals.  Whenever her sugars are closer to normal and actually lower than 200, she may inject less insulin for fear of dropping her sugars too low.  I advised her not to do so, since after such practice, her sugars increase significantly, even to 300s. -At this visit, we discussed about giving Ozempic another try since it is unclear whether this was causing increased lymph nodes in her neck during the last therapy period.  She does mention that she had some nausea with a 0.5 mg dose but she is willing to give it another try.  We will start at a low dose for 1 week and increase to 0.5 mg daily afterwards.  We  will stop Januvia while we are adding Ozempic.   -We will also try to split Levemir into 2 doses 20 units in a.m. and 50 units at bedtime -I also encouraged her to take the full dose of mealtime insulin even if her sugars are lower than 200 before meals since we do want to keep them well controlled -Since she is wondering whether lispro may not work well for her, we gave her a sample of NovoLog pen to try it and see if she gets better results. - I suggested to:  Patient Instructions  Please continue: - Glimepiride 8 mg before breakfast  - Levemir 60 units at bedtime - Lispro mealtime insulin 12 units before a smaller meal 14 units before a regular meal 16 units before a larger meal - Lispro Sliding scale: 150-199: + 4 units 200-250: + 8 units 251-300: + 10 units >300: + 12 units  Please start Ozempic 0.25 mg weekly in a.m. (for example on Sunday morning) x 4 weeks, then increase to 0.5 mg weekly in a.m. if no nausea or hypoglycemia.  Stop Januvia when you start Ozempic.  Please split: - Levemir into: 20 units in am and 50 units at bedtime  If you cannot eat, check sugars at the time of the meals and correct with sliding scale.  Please return in 2 months with  your sugar log.   - we checked her HbA1c: 9.7% (slightly better) - advised to check sugars at different times of the day - 3-4x a day, rotating check times - advised for yearly eye exams >> she is UTD - return to clinic in 2 months  2. R adrenal incidentaloma -I do not have imaging tests to review, but per Dr. Rosendo Gros notes, per adenoma measures more than 4 cm  -Right adrenalectomy is scheduled for 04/07/2020  -At last visit, we ruled out adrenal hormone production with normal catecholamines, metanephrines dexamethasone suppression test and aldosterone level  -I will see her back after the surgery I will check her for adrenal insufficiency at that time  Philemon Kingdom, MD PhD [facility]

## 2020-03-24 NOTE — Patient Instructions (Addendum)
Please continue: - Glimepiride 8 mg before breakfast  - Levemir 60 units at bedtime - Lispro mealtime insulin 12 units before a smaller meal 14 units before a regular meal 16 units before a larger meal - Lispro Sliding scale: 150-199: + 4 units 200-250: + 8 units 251-300: + 10 units >300: + 12 units  Please start Ozempic 0.25 mg weekly in a.m. (for example on Sunday morning) x 4 weeks, then increase to 0.5 mg weekly in a.m. if no nausea or hypoglycemia.  Stop Januvia when you start Ozempic.  Please split: - Levemir into: 20 units in am and 50 units at bedtime  If you cannot eat, check sugars at the time of the meals and correct with sliding scale.  Please return in 2 months with your sugar log.

## 2020-03-27 ENCOUNTER — Encounter (HOSPITAL_COMMUNITY)
Admission: RE | Admit: 2020-03-27 | Discharge: 2020-03-27 | Disposition: A | Discharge status: Home / Self Care | Payer: Medicare Other | Source: Ambulatory Visit | Attending: General Surgery | Admitting: General Surgery

## 2020-03-27 ENCOUNTER — Encounter (HOSPITAL_COMMUNITY): Payer: Self-pay

## 2020-03-27 ENCOUNTER — Other Ambulatory Visit: Payer: Self-pay

## 2020-03-27 HISTORY — DX: Personal history of diseases of the blood and blood-forming organs and certain disorders involving the immune mechanism: Z86.2

## 2020-03-27 HISTORY — DX: Essential (primary) hypertension: I10

## 2020-03-27 HISTORY — DX: Personal history of other diseases of the nervous system and sense organs: Z86.69

## 2020-03-27 HISTORY — DX: Malignant neoplasm of cervix uteri, unspecified: C53.9

## 2020-03-27 HISTORY — DX: Scoliosis, unspecified: M41.9

## 2020-03-27 HISTORY — DX: Unspecified osteoarthritis, unspecified site: M19.90

## 2020-03-27 HISTORY — DX: Bronchitis, not specified as acute or chronic: J40

## 2020-03-27 HISTORY — DX: Pneumonia, unspecified organism: J18.9

## 2020-03-27 NOTE — Progress Notes (Signed)
PCP - Dr. Gwenevere Ghazi Cardiologist - N/A  Chest x-ray - greater than 1 year EKG - 04/04/20 in epic Stress Test - greater than 2 years ECHO - greater than 2 years Cardiac Cath - N/A  Sleep Study - Yes CPAP - No  Fasting Blood Sugar - 140-150's Checks Blood Sugar ___7-8__ times a day  Blood Thinner Instructions: N/A Aspirin Instructions: N/A Last Dose: N/A  Anesthesia review: DM  Patient denies shortness of breath, fever, cough and chest pain at PAT appointment   Patient verbalized understanding of instructions that were given to them at the PAT appointment. Patient was also instructed that they will need to review over the PAT instructions again at home before surgery.

## 2020-03-29 ENCOUNTER — Encounter (HOSPITAL_COMMUNITY): Payer: Medicare Other

## 2020-04-04 ENCOUNTER — Other Ambulatory Visit (HOSPITAL_COMMUNITY): Payer: Medicare Other

## 2020-04-04 ENCOUNTER — Encounter (HOSPITAL_COMMUNITY)
Admission: RE | Admit: 2020-04-04 | Discharge: 2020-04-04 | Disposition: A | Discharge status: Home / Self Care | Payer: Medicare Other | Source: Ambulatory Visit | Attending: General Surgery | Admitting: General Surgery

## 2020-04-04 ENCOUNTER — Other Ambulatory Visit: Payer: Self-pay

## 2020-04-04 ENCOUNTER — Other Ambulatory Visit (HOSPITAL_COMMUNITY)
Admission: RE | Admit: 2020-04-04 | Discharge: 2020-04-04 | Disposition: A | Discharge status: Home / Self Care | Payer: Medicare Other | Source: Ambulatory Visit | Attending: General Surgery | Admitting: General Surgery

## 2020-04-04 DIAGNOSIS — Z01818 Encounter for other preprocedural examination: Secondary | ICD-10-CM | POA: Diagnosis present

## 2020-04-04 DIAGNOSIS — Z20822 Contact with and (suspected) exposure to covid-19: Secondary | ICD-10-CM | POA: Diagnosis not present

## 2020-04-04 DIAGNOSIS — Z01812 Encounter for preprocedural laboratory examination: Secondary | ICD-10-CM | POA: Diagnosis present

## 2020-04-04 DIAGNOSIS — R9431 Abnormal electrocardiogram [ECG] [EKG]: Secondary | ICD-10-CM | POA: Insufficient documentation

## 2020-04-04 LAB — CBC
HCT: 48.2 % — ABNORMAL HIGH (ref 36.0–46.0)
Hemoglobin: 15.8 g/dL — ABNORMAL HIGH (ref 12.0–15.0)
MCH: 30.7 pg (ref 26.0–34.0)
MCHC: 32.8 g/dL (ref 30.0–36.0)
MCV: 93.6 fL (ref 80.0–100.0)
Platelets: 339 10*3/uL (ref 150–400)
RBC: 5.15 MIL/uL — ABNORMAL HIGH (ref 3.87–5.11)
RDW: 13.1 % (ref 11.5–15.5)
WBC: 9.9 10*3/uL (ref 4.0–10.5)
nRBC: 0 % (ref 0.0–0.2)

## 2020-04-04 LAB — BASIC METABOLIC PANEL WITH GFR
Anion gap: 9 (ref 5–15)
BUN: 6 mg/dL (ref 6–20)
CO2: 25 mmol/L (ref 22–32)
Calcium: 9 mg/dL (ref 8.9–10.3)
Chloride: 103 mmol/L (ref 98–111)
Creatinine, Ser: 0.55 mg/dL (ref 0.44–1.00)
GFR calc Af Amer: >60 mL/min
GFR calc non Af Amer: >60 mL/min
Glucose, Bld: 151 mg/dL — ABNORMAL HIGH (ref 70–99)
Potassium: 4.2 mmol/L (ref 3.5–5.1)
Sodium: 137 mmol/L (ref 135–145)

## 2020-04-04 LAB — HEMOGLOBIN A1C
Hgb A1c MFr Bld: 9 % — ABNORMAL HIGH (ref 4.8–5.6)
Mean Plasma Glucose: 211.6 mg/dL

## 2020-04-04 LAB — SARS CORONAVIRUS 2 (TAT 6-24 HRS): SARS Coronavirus 2: NEGATIVE

## 2020-04-06 MED ORDER — VANCOMYCIN HCL 1500 MG/300ML IV SOLN
1500.0000 mg | INTRAVENOUS | Status: AC
  Administered 2020-04-07: 1500 mg via INTRAVENOUS
  Filled 2020-04-06: qty 300

## 2020-04-07 ENCOUNTER — Encounter (HOSPITAL_COMMUNITY)
Admission: RE | Disposition: A | Discharge status: Home / Self Care | Payer: Self-pay | Source: Home / Self Care | Attending: General Surgery

## 2020-04-07 ENCOUNTER — Inpatient Hospital Stay (HOSPITAL_COMMUNITY): Payer: Medicare Other | Admitting: Anesthesiology

## 2020-04-07 ENCOUNTER — Other Ambulatory Visit: Payer: Self-pay

## 2020-04-07 ENCOUNTER — Encounter (HOSPITAL_COMMUNITY): Payer: Self-pay | Admitting: General Surgery

## 2020-04-07 ENCOUNTER — Inpatient Hospital Stay (HOSPITAL_COMMUNITY): Payer: Medicare Other | Admitting: Physician Assistant

## 2020-04-07 ENCOUNTER — Inpatient Hospital Stay (HOSPITAL_COMMUNITY)
Admission: RE | Admit: 2020-04-07 | Discharge: 2020-04-10 | DRG: 615 | Disposition: A | Discharge status: Home / Self Care | Payer: Medicare Other | Attending: General Surgery | Admitting: General Surgery

## 2020-04-07 DIAGNOSIS — E896 Postprocedural adrenocortical (-medullary) hypofunction: Secondary | ICD-10-CM

## 2020-04-07 DIAGNOSIS — E785 Hyperlipidemia, unspecified: Secondary | ICD-10-CM | POA: Diagnosis present

## 2020-04-07 DIAGNOSIS — Z20822 Contact with and (suspected) exposure to covid-19: Secondary | ICD-10-CM | POA: Diagnosis present

## 2020-04-07 DIAGNOSIS — K219 Gastro-esophageal reflux disease without esophagitis: Secondary | ICD-10-CM | POA: Diagnosis present

## 2020-04-07 DIAGNOSIS — F17200 Nicotine dependence, unspecified, uncomplicated: Secondary | ICD-10-CM | POA: Diagnosis present

## 2020-04-07 DIAGNOSIS — E669 Obesity, unspecified: Secondary | ICD-10-CM

## 2020-04-07 DIAGNOSIS — D3501 Benign neoplasm of right adrenal gland: Principal | ICD-10-CM | POA: Diagnosis present

## 2020-04-07 DIAGNOSIS — M549 Dorsalgia, unspecified: Secondary | ICD-10-CM | POA: Diagnosis present

## 2020-04-07 DIAGNOSIS — F329 Major depressive disorder, single episode, unspecified: Secondary | ICD-10-CM | POA: Diagnosis present

## 2020-04-07 DIAGNOSIS — E119 Type 2 diabetes mellitus without complications: Secondary | ICD-10-CM | POA: Diagnosis present

## 2020-04-07 DIAGNOSIS — I10 Essential (primary) hypertension: Secondary | ICD-10-CM | POA: Diagnosis present

## 2020-04-07 DIAGNOSIS — F319 Bipolar disorder, unspecified: Secondary | ICD-10-CM | POA: Diagnosis present

## 2020-04-07 DIAGNOSIS — Z794 Long term (current) use of insulin: Secondary | ICD-10-CM

## 2020-04-07 HISTORY — PX: ROBOTIC ADRENALECTOMY: SHX6407

## 2020-04-07 LAB — GLUCOSE, CAPILLARY
Glucose-Capillary: 147 mg/dL — ABNORMAL HIGH (ref 70–99)
Glucose-Capillary: 222 mg/dL — ABNORMAL HIGH (ref 70–99)
Glucose-Capillary: 289 mg/dL — ABNORMAL HIGH (ref 70–99)

## 2020-04-07 SURGERY — ADRENALECTOMY, ROBOT-ASSISTED
Anesthesia: General | Site: Abdomen | Laterality: Right

## 2020-04-07 MED ORDER — ONDANSETRON HCL 4 MG/2ML IJ SOLN
4.0000 mg | Freq: Four times a day (QID) | INTRAMUSCULAR | Status: DC | PRN
  Administered 2020-04-09: 4 mg via INTRAVENOUS
  Filled 2020-04-07: qty 2

## 2020-04-07 MED ORDER — OXYCODONE HCL 5 MG PO TABS
5.0000 mg | ORAL_TABLET | ORAL | Status: DC | PRN
  Administered 2020-04-07: 5 mg via ORAL
  Filled 2020-04-07: qty 1

## 2020-04-07 MED ORDER — HYDROMORPHONE HCL 2 MG/ML IJ SOLN
INTRAMUSCULAR | Status: AC
  Filled 2020-04-07: qty 1

## 2020-04-07 MED ORDER — ROCURONIUM BROMIDE 10 MG/ML (PF) SYRINGE
PREFILLED_SYRINGE | INTRAVENOUS | Status: DC | PRN
  Administered 2020-04-07: 100 mg via INTRAVENOUS
  Administered 2020-04-07: 30 mg via INTRAVENOUS

## 2020-04-07 MED ORDER — HYDROMORPHONE HCL 1 MG/ML IJ SOLN
1.0000 mg | INTRAMUSCULAR | Status: DC | PRN
  Administered 2020-04-07 – 2020-04-08 (×3): 1 mg via INTRAVENOUS
  Filled 2020-04-07 (×4): qty 1

## 2020-04-07 MED ORDER — PROPOFOL 10 MG/ML IV BOLUS
INTRAVENOUS | Status: AC
  Filled 2020-04-07: qty 20

## 2020-04-07 MED ORDER — FENTANYL CITRATE (PF) 100 MCG/2ML IJ SOLN
INTRAMUSCULAR | Status: AC
  Filled 2020-04-07: qty 4

## 2020-04-07 MED ORDER — LISINOPRIL 5 MG PO TABS
5.0000 mg | ORAL_TABLET | Freq: Every day | ORAL | Status: DC
  Administered 2020-04-07 – 2020-04-10 (×4): 5 mg via ORAL
  Filled 2020-04-07 (×4): qty 1

## 2020-04-07 MED ORDER — ONDANSETRON HCL 4 MG/2ML IJ SOLN
INTRAMUSCULAR | Status: DC | PRN
  Administered 2020-04-07: 4 mg via INTRAVENOUS

## 2020-04-07 MED ORDER — SUCCINYLCHOLINE CHLORIDE 200 MG/10ML IV SOSY
PREFILLED_SYRINGE | INTRAVENOUS | Status: AC
  Filled 2020-04-07: qty 10

## 2020-04-07 MED ORDER — CHLORHEXIDINE GLUCONATE CLOTH 2 % EX PADS
6.0000 | MEDICATED_PAD | Freq: Once | CUTANEOUS | Status: AC
  Administered 2020-04-07: 12:00:00 6 via TOPICAL

## 2020-04-07 MED ORDER — CHLORHEXIDINE GLUCONATE CLOTH 2 % EX PADS
6.0000 | MEDICATED_PAD | Freq: Once | CUTANEOUS | Status: AC
  Administered 2020-04-07: 6 via TOPICAL

## 2020-04-07 MED ORDER — LACTATED RINGERS IV SOLN: INTRAVENOUS | Status: DC

## 2020-04-07 MED ORDER — KETOROLAC TROMETHAMINE 30 MG/ML IJ SOLN: 30.0000 mg | Freq: Four times a day (QID) | INTRAMUSCULAR | Status: DC | PRN

## 2020-04-07 MED ORDER — FENTANYL CITRATE (PF) 250 MCG/5ML IJ SOLN
INTRAMUSCULAR | Status: AC
  Filled 2020-04-07: qty 5

## 2020-04-07 MED ORDER — LACTATED RINGERS IR SOLN
Status: DC | PRN
  Administered 2020-04-07: 1000 mL

## 2020-04-07 MED ORDER — FENTANYL CITRATE (PF) 100 MCG/2ML IJ SOLN
25.0000 ug | INTRAMUSCULAR | Status: DC | PRN
  Administered 2020-04-07 (×3): 50 ug via INTRAVENOUS

## 2020-04-07 MED ORDER — ROCURONIUM BROMIDE 10 MG/ML (PF) SYRINGE
PREFILLED_SYRINGE | INTRAVENOUS | Status: AC
  Filled 2020-04-07: qty 10

## 2020-04-07 MED ORDER — DEXTROSE-NACL 5-0.9 % IV SOLN: INTRAVENOUS | Status: DC

## 2020-04-07 MED ORDER — DEXAMETHASONE SODIUM PHOSPHATE 10 MG/ML IJ SOLN
INTRAMUSCULAR | Status: AC
  Filled 2020-04-07: qty 1

## 2020-04-07 MED ORDER — MIDAZOLAM HCL 2 MG/2ML IJ SOLN
INTRAMUSCULAR | Status: AC
  Filled 2020-04-07: qty 2

## 2020-04-07 MED ORDER — BUPIVACAINE-EPINEPHRINE (PF) 0.5% -1:200000 IJ SOLN
INTRAMUSCULAR | Status: DC | PRN
  Administered 2020-04-07: 30 mL

## 2020-04-07 MED ORDER — INSULIN ASPART 100 UNIT/ML ~~LOC~~ SOLN
0.0000 [IU] | Freq: Three times a day (TID) | SUBCUTANEOUS | Status: DC
  Administered 2020-04-08: 5 [IU] via SUBCUTANEOUS
  Administered 2020-04-08: 3 [IU] via SUBCUTANEOUS
  Administered 2020-04-09: 5 [IU] via SUBCUTANEOUS
  Administered 2020-04-09: 2 [IU] via SUBCUTANEOUS
  Administered 2020-04-09: 5 [IU] via SUBCUTANEOUS
  Administered 2020-04-10: 3 [IU] via SUBCUTANEOUS

## 2020-04-07 MED ORDER — ACETAMINOPHEN 500 MG PO TABS
1000.0000 mg | ORAL_TABLET | ORAL | Status: AC
  Administered 2020-04-07: 1000 mg via ORAL
  Filled 2020-04-07: qty 2

## 2020-04-07 MED ORDER — FENTANYL CITRATE (PF) 250 MCG/5ML IJ SOLN
INTRAMUSCULAR | Status: DC | PRN
  Administered 2020-04-07 (×2): 50 ug via INTRAVENOUS
  Administered 2020-04-07: 100 ug via INTRAVENOUS
  Administered 2020-04-07: 50 ug via INTRAVENOUS

## 2020-04-07 MED ORDER — BUPIVACAINE-EPINEPHRINE (PF) 0.5% -1:200000 IJ SOLN
INTRAMUSCULAR | Status: AC
  Filled 2020-04-07: qty 30

## 2020-04-07 MED ORDER — ONDANSETRON HCL 4 MG/2ML IJ SOLN
INTRAMUSCULAR | Status: AC
  Filled 2020-04-07: qty 2

## 2020-04-07 MED ORDER — LIDOCAINE HCL 2 % IJ SOLN
INTRAMUSCULAR | Status: AC
  Filled 2020-04-07: qty 20

## 2020-04-07 MED ORDER — KETAMINE HCL 10 MG/ML IJ SOLN
INTRAMUSCULAR | Status: DC | PRN
  Administered 2020-04-07: 50 mg via INTRAVENOUS

## 2020-04-07 MED ORDER — GABAPENTIN 100 MG PO CAPS
200.0000 mg | ORAL_CAPSULE | ORAL | Status: AC
  Administered 2020-04-07: 200 mg via ORAL
  Filled 2020-04-07: qty 2

## 2020-04-07 MED ORDER — PHENYLEPHRINE 40 MCG/ML (10ML) SYRINGE FOR IV PUSH (FOR BLOOD PRESSURE SUPPORT)
PREFILLED_SYRINGE | INTRAVENOUS | Status: AC
  Filled 2020-04-07: qty 10

## 2020-04-07 MED ORDER — SUGAMMADEX SODIUM 500 MG/5ML IV SOLN
INTRAVENOUS | Status: AC
  Filled 2020-04-07: qty 5

## 2020-04-07 MED ORDER — LIDOCAINE 2% (20 MG/ML) 5 ML SYRINGE
INTRAMUSCULAR | Status: AC
  Filled 2020-04-07: qty 5

## 2020-04-07 MED ORDER — LIDOCAINE 20MG/ML (2%) 15 ML SYRINGE OPTIME
INTRAMUSCULAR | Status: DC | PRN
  Administered 2020-04-07: 1.5 mg/kg/h via INTRAVENOUS

## 2020-04-07 MED ORDER — HYDROMORPHONE HCL 1 MG/ML IJ SOLN
INTRAMUSCULAR | Status: DC | PRN
  Administered 2020-04-07: .5 mg via INTRAVENOUS
  Administered 2020-04-07: 1 mg via INTRAVENOUS
  Administered 2020-04-07: .5 mg via INTRAVENOUS

## 2020-04-07 MED ORDER — ONDANSETRON 4 MG PO TBDP: 4.0000 mg | ORAL_TABLET | Freq: Four times a day (QID) | ORAL | Status: DC | PRN

## 2020-04-07 MED ORDER — LIDOCAINE 2% (20 MG/ML) 5 ML SYRINGE
INTRAMUSCULAR | Status: DC | PRN
  Administered 2020-04-07: 60 mg via INTRAVENOUS

## 2020-04-07 MED ORDER — HYDRALAZINE HCL 20 MG/ML IJ SOLN: 10.0000 mg | INTRAMUSCULAR | Status: DC | PRN

## 2020-04-07 MED ORDER — DEXAMETHASONE SODIUM PHOSPHATE 10 MG/ML IJ SOLN
INTRAMUSCULAR | Status: DC | PRN
  Administered 2020-04-07: 4 mg via INTRAVENOUS

## 2020-04-07 MED ORDER — 0.9 % SODIUM CHLORIDE (POUR BTL) OPTIME
TOPICAL | Status: DC | PRN
  Administered 2020-04-07: 1000 mL

## 2020-04-07 MED ORDER — EPHEDRINE 5 MG/ML INJ
INTRAVENOUS | Status: AC
  Filled 2020-04-07: qty 10

## 2020-04-07 MED ORDER — PANTOPRAZOLE SODIUM 40 MG PO TBEC
40.0000 mg | DELAYED_RELEASE_TABLET | Freq: Every day | ORAL | Status: DC
  Administered 2020-04-07 – 2020-04-10 (×4): 40 mg via ORAL
  Filled 2020-04-07 (×4): qty 1

## 2020-04-07 MED ORDER — MIDAZOLAM HCL 5 MG/5ML IJ SOLN
INTRAMUSCULAR | Status: DC | PRN
  Administered 2020-04-07: 2 mg via INTRAVENOUS

## 2020-04-07 MED ORDER — PROPOFOL 10 MG/ML IV BOLUS
INTRAVENOUS | Status: DC | PRN
  Administered 2020-04-07: 150 mg via INTRAVENOUS

## 2020-04-07 SURGICAL SUPPLY — 69 items
ADH SKN CLS APL DERMABOND .7 (GAUZE/BANDAGES/DRESSINGS) ×1
APL PRP STRL LF DISP 70% ISPRP (MISCELLANEOUS) ×1
APPLIER CLIP 5 13 M/L LIGAMAX5 (MISCELLANEOUS)
APR CLP MED LRG 5 ANG JAW (MISCELLANEOUS)
BAG SPEC RTRVL 10 TROC 200 (ENDOMECHANICALS) ×1
BLADE SURG SZ11 CARB STEEL (BLADE) ×2 IMPLANT
CHLORAPREP W/TINT 26 (MISCELLANEOUS) ×2 IMPLANT
CLIP APPLIE 5 13 M/L LIGAMAX5 (MISCELLANEOUS) IMPLANT
CLIP VESOLOCK LG 6/CT PURPLE (CLIP) ×4 IMPLANT
CLIP VESOLOCK MED LG 6/CT (CLIP) ×1 IMPLANT
CONNECTOR 5 IN 1 STRAIGHT STRL (MISCELLANEOUS) ×1 IMPLANT
COVER SURGICAL LIGHT HANDLE (MISCELLANEOUS) ×2 IMPLANT
COVER TIP SHEARS 8 DVNC (MISCELLANEOUS) ×1 IMPLANT
COVER TIP SHEARS 8MM DA VINCI (MISCELLANEOUS) ×2
COVER WAND RF STERILE (DRAPES) IMPLANT
DECANTER SPIKE VIAL GLASS SM (MISCELLANEOUS) ×2 IMPLANT
DERMABOND ADVANCED (GAUZE/BANDAGES/DRESSINGS) ×1
DERMABOND ADVANCED .7 DNX12 (GAUZE/BANDAGES/DRESSINGS) ×1 IMPLANT
DEVICE TROCAR PUNCTURE CLOSURE (ENDOMECHANICALS) ×2 IMPLANT
DRAIN CHANNEL 19F RND (DRAIN) IMPLANT
DRAPE ARM DVNC X/XI (DISPOSABLE) ×4 IMPLANT
DRAPE COLUMN DVNC XI (DISPOSABLE) ×1 IMPLANT
DRAPE CV SPLIT W-CLR ANES SCRN (DRAPES) ×2 IMPLANT
DRAPE DA VINCI XI ARM (DISPOSABLE) ×8
DRAPE DA VINCI XI COLUMN (DISPOSABLE) ×2
DRAPE PERI GROIN 82X75IN TIB (DRAPES) ×2 IMPLANT
DRAPE SHEET LG 3/4 BI-LAMINATE (DRAPES) ×1 IMPLANT
DRAPE WARM FLUID 44X44 (DRAPES) ×2 IMPLANT
DRSG TEGADERM 6X8 (GAUZE/BANDAGES/DRESSINGS) ×1 IMPLANT
ELECT REM PT RETURN 15FT ADLT (MISCELLANEOUS) ×2 IMPLANT
GAUZE SPONGE 2X2 8PLY STRL LF (GAUZE/BANDAGES/DRESSINGS) ×1 IMPLANT
GAUZE SPONGE 4X4 12PLY STRL (GAUZE/BANDAGES/DRESSINGS) IMPLANT
GLOVE BIO SURGEON STRL SZ7.5 (GLOVE) ×4 IMPLANT
GOWN STRL REUS W/TWL XL LVL3 (GOWN DISPOSABLE) ×6 IMPLANT
KIT BASIN (CUSTOM PROCEDURE TRAY) ×2 IMPLANT
KIT TURNOVER KIT A (KITS) IMPLANT
MARKER SKIN DUAL TIP RULER LAB (MISCELLANEOUS) ×1 IMPLANT
NEEDLE HYPO 22GX1.5 SAFETY (NEEDLE) ×2 IMPLANT
NEEDLE INSUFFLATION 14GA 120MM (NEEDLE) ×2 IMPLANT
PACK GENERAL/GYN (CUSTOM PROCEDURE TRAY) ×1 IMPLANT
PENCIL SMOKE EVACUATOR (MISCELLANEOUS) IMPLANT
POUCH RETRIEVAL ECOSAC 10 (ENDOMECHANICALS) ×1 IMPLANT
POUCH RETRIEVAL ECOSAC 10MM (ENDOMECHANICALS) ×2
PROTECTOR NERVE ULNAR (MISCELLANEOUS) ×2 IMPLANT
SCISSORS LAP 5X35 DISP (ENDOMECHANICALS) ×2 IMPLANT
SEAL CANN UNIV 5-8 DVNC XI (MISCELLANEOUS) ×4 IMPLANT
SEAL XI 5MM-8MM UNIVERSAL (MISCELLANEOUS) ×8
SEALER VESSEL DA VINCI XI (MISCELLANEOUS) ×2
SEALER VESSEL EXT DVNC XI (MISCELLANEOUS) IMPLANT
SET IRRIG TUBING LAPAROSCOPIC (IRRIGATION / IRRIGATOR) ×2 IMPLANT
SET TUBE SMOKE EVAC HIGH FLOW (TUBING) ×1 IMPLANT
SOLUTION ELECTROLUBE (MISCELLANEOUS) ×2 IMPLANT
SPONGE GAUZE 2X2 STER 10/PKG (GAUZE/BANDAGES/DRESSINGS)
SPONGE LAP 18X18 RF (DISPOSABLE) IMPLANT
STOPCOCK 4 WAY LG BORE MALE ST (IV SETS) ×2 IMPLANT
SUT MNCRL AB 4-0 PS2 18 (SUTURE) ×2 IMPLANT
SUT PDS AB 1 CTX 36 (SUTURE) IMPLANT
SUT VIC AB 1 CT1 27 (SUTURE) ×2
SUT VIC AB 1 CT1 27XBRD ANTBC (SUTURE) IMPLANT
SUT VICRYL 0 TIES 12 18 (SUTURE) IMPLANT
SYR 10ML ECCENTRIC (SYRINGE) ×2 IMPLANT
SYR 20ML LL LF (SYRINGE) ×2 IMPLANT
TAPE CLOTH 4X10 WHT NS (GAUZE/BANDAGES/DRESSINGS) ×2 IMPLANT
TOWEL OR 17X26 10 PK STRL BLUE (TOWEL DISPOSABLE) ×2 IMPLANT
TOWEL OR NON WOVEN STRL DISP B (DISPOSABLE) ×2 IMPLANT
TRAY FOLEY MTR SLVR 14FR STAT (SET/KITS/TRAYS/PACK) ×2 IMPLANT
TROCAR ADV FIXATION 5X100MM (TROCAR) ×2 IMPLANT
TUBING CONNECTING 10 (TUBING) ×1 IMPLANT
TUBING INSUFFLATION 10FT LAP (TUBING) ×1 IMPLANT

## 2020-04-07 NOTE — Anesthesia Preprocedure Evaluation (Signed)
Anesthesia Evaluation  Patient identified by MRN, date of birth, ID band Patient awake    Reviewed: Allergy & Precautions, NPO status , Patient's Chart, lab work & pertinent test results  Airway Mallampati: I  TM Distance: >3 FB Neck ROM: Full  Mouth opening: Limited Mouth Opening  Dental  (+) Dental Advisory Given, Caps,    Pulmonary neg pulmonary ROS, Current Smoker and Patient abstained from smoking.,    Pulmonary exam normal breath sounds clear to auscultation       Cardiovascular hypertension, Normal cardiovascular exam Rhythm:Regular Rate:Normal  HLD   Neuro/Psych negative neurological ROS  negative psych ROS   GI/Hepatic Neg liver ROS, GERD  Medicated,  Endo/Other  negative endocrine ROSdiabetes, Well Controlled, Type 2, Insulin Dependent, Oral Hypoglycemic Agents  Renal/GU negative Renal ROS  negative genitourinary   Musculoskeletal  (+) Arthritis ,   Abdominal   Peds  Hematology negative hematology ROS (+)   Anesthesia Other Findings   Reproductive/Obstetrics                             Anesthesia Physical Anesthesia Plan  ASA: II  Anesthesia Plan: General   Post-op Pain Management:    Induction: Intravenous  PONV Risk Score and Plan: 2 and Midazolam, Dexamethasone and Ondansetron  Airway Management Planned: Oral ETT  Additional Equipment:   Intra-op Plan:   Post-operative Plan: Extubation in OR  Informed Consent: I have reviewed the patients History and Physical, chart, labs and discussed the procedure including the risks, benefits and alternatives for the proposed anesthesia with the patient or authorized representative who has indicated his/her understanding and acceptance.     Dental advisory given  Plan Discussed with: CRNA  Anesthesia Plan Comments:         Anesthesia Quick Evaluation

## 2020-04-07 NOTE — Transfer of Care (Signed)
Immediate Anesthesia Transfer of Care Note  Patient: Valerie Diaz  Procedure(s) Performed: Procedure(s): XI ROBOTIC RIGHT ADRENALECTOMY (Right)  Patient Location: PACU  Anesthesia Type:General  Level of Consciousness:  sedated, patient cooperative and responds to stimulation  Airway & Oxygen Therapy:Patient Spontanous Breathing and Patient connected to face mask oxgen  Post-op Assessment:  Report given to PACU RN and Post -op Vital signs reviewed and stable  Post vital signs:  Reviewed and stable  Last Vitals:  Vitals:   04/07/20 1232 04/07/20 1508  BP: 140/78 (!) 145/93  Pulse: 89 95  Resp: 18 19  Temp: 36.6 C 36.6 C  SpO2: [id number] [id number]    Complications: No apparent anesthesia complications

## 2020-04-07 NOTE — H&P (Signed)
History of Present Illness  The patient is a 54 year old female who presents wtih an adrenal mass. Patient is a 54 year old female who follows back up today secondary to right renal mass. Patient recently visited with Dr. 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 This was negative for pheochromocytoma, Conn syndrome, indications syndrome.  Patient states she's down to approximately 10 cigarettes today. Patient has been working on her blood sugars as per Dr.Gherge patient does have a history of an open cholecystectomy or possibly 30 years ago.     --------------------------------- Patient is a 54 year old female who is referred by Dr. 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 Patient was recently in the ER at outside hospital for constipation. She underwent CT scan and was found to have a right-sided adrenal mass. Subsequent patient underwent ultrasound. I did not have the studies currently have these are from an outside hospital. We'll work on obtaining the studies I can visualize the mass.  Patient does state that she has issues with tachycardia. Patient recently underwent workup for tachycardia was not found to have a reason for tachycardia. She states that her heart rate is elevated at 130+. She monitors this at home. She states that she is only on lisinopril and has not had any significant hypertension issues.  Patient does have a history of lung cancer and pancreaticc ancer history in her grandparents.  Patient has had a previous open cholecystectomy, multiple back surgeries.  Patient states that she smokes approximately half pack a day of cigarettes.   Allergies  Penicillin G Benzathine & Proc *PENICILLINS*  Shellfish  Allergies Reconciled    Medication History  HYDROcodone-Acetaminophen (10-325MG  Tablet, Oral) Active. Levemir FlexTouch (100UNIT/ML Soln Pen-inj, Subcutaneous) Active. Lisinopril (5MG  Tablet, Oral) Active. Estradiol (1MG  Tablet, Oral) Active. Atorvastatin Calcium (80MG  Tablet, Oral) Active. Glimepiride (4MG  Tablet, Oral) Active. Januvia (100MG  Tablet, Oral) Active. Fexofenadine HCl (Oral) Specific strength unknown - Active. Promethazine HCl (25MG  Tablet, Oral) Active. Fluticasone Propionate (50MCG/ACT Suspension, Nasal) Active. Insulin Lispro (100UNIT/ML Soln Cartridge, Subcutaneous) Active. Probiotic (250MG  Capsule, Oral) Active. Stool Softener (100MG  Tablet, Oral) Active. Gas Relief (180MG  Capsule, Oral) Active. Medications Reconciled    Review of Systems  General Present- Appetite Loss, Fatigue, Night Sweats and Weight Loss. Not Present- Chills, Fever and Weight Gain. Skin Present- Dryness, New Lesions and Non-Healing Wounds. Not Present- Change in Wart/Mole, Hives, Jaundice, Rash and Ulcer. HEENT Present- Seasonal Allergies, Sinus Pain and Wears glasses/contact lenses. Not Present- Earache, Hearing Loss, Hoarseness, Nose Bleed, Oral Ulcers, Ringing in the Ears, Sore Throat, Visual Disturbances and Yellow Eyes. Breast Present- Nipple Discharge and Skin Changes. Not Present- Breast Mass and Breast Pain. Cardiovascular Present- Rapid Heart Rate and Swelling of Extremities. Not Present- Chest Pain, Difficulty Breathing Lying Down, Leg Cramps, Palpitations and Shortness of Breath. Gastrointestinal Present- Bloating, Change in Bowel Habits, Constipation, Excessive gas, Gets full quickly at meals, Indigestion and Nausea. Not Present- Abdominal Pain, Bloody Stool, Chronic diarrhea, Difficulty Swallowing, Hemorrhoids, Rectal Pain and Vomiting. Female Genitourinary Present- Frequency, Nocturia and Urgency. Not Present- Painful Urination and Pelvic Pain. Musculoskeletal Present- Back Pain, Joint  Pain, Joint Stiffness and Swelling of Extremities. Not Present- Muscle Pain and Muscle Weakness. Neurological Present- Headaches and Weakness. Not Present- Decreased Memory, Fainting, Numbness, Seizures, Tingling, Tremor and Trouble walking. Psychiatric Present- Anxiety, Change in Sleep Pattern and Frequent  crying. Not Present- Bipolar, Depression and Fearful. Endocrine Present- Cold Intolerance, Heat Intolerance, Hot flashes and New Diabetes. Not Present- Excessive Hunger and Hair Changes. Hematology Present- Easy Bruising. Not Present- Blood Thinners, Excessive bleeding, Gland problems, HIV and Persistent Infections.    BP (!) 145/93 (BP Location: Right Arm)   Pulse 95   Temp 97.8 F (36.6 C)   Resp 19   Ht 5\' 5"  (1.651 m)   Wt 98.1 kg   LMP 08/15/2012   SpO2 98%   BMI 35.99 kg/m     Physical Exam  The physical exam findings are as follows: Note: Constitutional: No acute distress, conversant, appears stated age  Eyes: Anicteric sclerae, moist conjunctiva, no lid lag  Neck: No thyromegaly, trachea midline, no cervical lymphadenopathy  Lungs: Clear to auscultation biilaterally, normal respiratory effot  Cardiovascular: regular rate & rhythm, no murmurs, no peripheal edema, pedal pulses 2+  GI: Soft, no masses or hepatosplenomegaly, non-tender to palpation  MSK: Normal gait, no clubbing cyanosis, edema  Skin: No rashes, palpation reveals normal skin turgor  Psychiatric: Appropriate judgment and insight, oriented to person, place, and time    Assessment & Plan ( ADRENAL MASS (E27.8) Impression: Patient is a 54 year old female with a history of diabetes, hyperlipidemia, major depressive disorder, 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 1. We'll proceed to the operating room for a right robotic adrenalectomy. 2. I discussed with patient the risks and benefits of the procedure to include but not limited to: Infection, bleeding, damage to structures,  possible need further surgery. The patient was understanding and wished to proceed.  I reviewed the patient's external notes from the referring physicians as well as consulting physician team. Each of the radiologic studies and lab studies were independently reviewed and interpreted. I discussed the results of the above studies and how they relate to the patient's surgical problems.

## 2020-04-07 NOTE — Anesthesia Postprocedure Evaluation (Signed)
Anesthesia Post Note  Patient: Valerie Diaz  Procedure(s) Performed: XI ROBOTIC RIGHT ADRENALECTOMY (Right Abdomen)     Patient location during evaluation: PACU Anesthesia Type: General Level of consciousness: sedated, oriented and patient cooperative Pain management: pain level controlled Vital Signs Assessment: post-procedure vital signs reviewed and stable Respiratory status: spontaneous breathing, nonlabored ventilation and respiratory function stable Cardiovascular status: blood pressure returned to baseline and stable Postop Assessment: no apparent nausea or vomiting Anesthetic complications: no    Last Vitals:  Vitals:   04/07/20 1545 04/07/20 1600  BP: (!) 146/81 125/79  Pulse: 85 83  Resp: 11 16  Temp:  36.5 C  SpO2: 99% 97%    Last Pain:  Vitals:   04/07/20 1600  TempSrc:   PainSc: Asleep                 Richell Corker,E. Evelene Roussin

## 2020-04-07 NOTE — Anesthesia Procedure Notes (Signed)
Procedure Name: Intubation Date/Time: 04/07/2020 1:07 PM Performed by: Anne Fu, CRNA Pre-anesthesia Checklist: Patient identified, Emergency Drugs available, Suction available, Patient being monitored and Timeout performed Patient Re-evaluated:Patient Re-evaluated prior to induction Oxygen Delivery Method: Circle system utilized Preoxygenation: Pre-oxygenation with 100% oxygen Induction Type: IV induction Ventilation: Mask ventilation without difficulty Laryngoscope Size: Mac and 3 Grade View: Grade I Tube type: Oral Tube size: 7.5 mm Number of attempts: 1 Airway Equipment and Method: Stylet Placement Confirmation: ETT inserted through vocal cords under direct vision,  positive ETCO2 and breath sounds checked- equal and bilateral Secured at: 22 cm Tube secured with: Tape Dental Injury: Teeth and Oropharynx as per pre-operative assessment  Comments: Successful intubation by K. Forde Dandy. Atraumatic.

## 2020-04-07 NOTE — Op Note (Signed)
04/07/2020  2:52 PM  PATIENT:  Valerie Diaz  54 y.o. female  PRE-OPERATIVE DIAGNOSIS:  RIGHT ADRENAL MASS  POST-OPERATIVE DIAGNOSIS:  RIGHT ADRENAL MASS  PROCEDURE:  Procedure(s): XI ROBOTIC RIGHT ADRENALECTOMY (Right)  SURGEON:  Surgeon(s) and Role:    Ralene Ok, MD - Primary    Michael Boston, MD - Assisting who was essential at assisting with retraction, identification of the anatomy, and with hemostasis.  ANESTHESIA:   local and general  EBL:  50 mL   BLOOD ADMINISTERED:none  DRAINS: none   LOCAL MEDICATIONS USED:  BUPIVICAINE   SPECIMEN:  Source of Specimen:  Right adrenal gland and mass  DISPOSITION OF SPECIMEN:  PATHOLOGY  COUNTS:  YES  TOURNIQUET:  * No tourniquets in log *  DICTATION: .Dragon Dictation Indication for procedure: Patient is a 54 year old female with a 3.7 cm right adrenal mass.  Patient underwent work-up with endocrinology to see this was a functioning tumor.  This appeared to be a nonfunctioning tumor.  Patient was counseled in the clinic and decided to have this removed for fear of possible carcinoma.  Findings: Patient had a right adrenal mass, approximate 4 cm.  Patient had a short right adrenal vein as expected.  This was doubly ligated with purple clips robotically.  The area was hemostatic.  Details of the procedure: After the patient was consented she was taken back to the OR and placed in the supine position with bilateral SCDs in place.  She underwent general endotracheal intubation.  Patient was then placed in the lateral right side up position.  Patient was then prepped and draped in the standard fashion.  A timeout was called and all facts verified.  A Veress needle technique was used to insufflate the abdomen to 15 mmHg in the right subcostal margin.  Subsequent to this a 8 mm trocar was placed intra-abdominal.  There is no injury to any intra-abdominal organs.  At this time the working 8 mm ports were placed  approximately 10 cm away underneath the right subcostal margin under direct visualization.  At this time the instruments were introduced.  The liver was retracted cephalad.  The peritoneal layer was then dissected away from the edge of the liver.  The adrenal gland could be seen bulging for the retroperitoneum.  At this time the dissection was taken medially from the edge of the liver towards the IVC.  The IVC peritoneum was then dissected away.  We proceeded from inferior to superior direction.  The right adrenal vein could easily be visualized.  This was circumferentially dissected away from surrounding tissue.  At this time the right adrenal vein was doubly ligated using purple robotic clips and transected in between.  The area was hemostatic.  A vessel sealer was then used to dissect away the adrenal gland and mass en bloc away from the retroperitoneum and surrounding tissue.  Once this was achieved a retrieval bag was then placed intra-abdominally.  The mass and adrenal gland were then placed into the bag.  The adrenal bed was again visualized and seemed to be hemostatic.  At this time the bag and the adrenal mass and contents were then removed from the abdominal cavity.  The site of extraction was then reapproximated using #1 Vicryl in interrupted fashion x2.  At this time insufflation was evacuated.  The skin at all trocar sites were then reapproximated using 4-0 Monocryl subcuticular fashion.  Skin was dressed with Dermabond.  The patient tolerated procedure well was taken  to the recovery in stable condition.  PLAN OF CARE: Admit for overnight observation  PATIENT DISPOSITION:  PACU - hemodynamically stable.   Delay start of Pharmacological VTE agent (>24hrs) due to surgical blood loss or risk of bleeding: not applicable

## 2020-04-08 DIAGNOSIS — E785 Hyperlipidemia, unspecified: Secondary | ICD-10-CM | POA: Diagnosis present

## 2020-04-08 DIAGNOSIS — E119 Type 2 diabetes mellitus without complications: Secondary | ICD-10-CM | POA: Diagnosis present

## 2020-04-08 DIAGNOSIS — F319 Bipolar disorder, unspecified: Secondary | ICD-10-CM | POA: Diagnosis present

## 2020-04-08 DIAGNOSIS — Z20822 Contact with and (suspected) exposure to covid-19: Secondary | ICD-10-CM | POA: Diagnosis present

## 2020-04-08 DIAGNOSIS — K219 Gastro-esophageal reflux disease without esophagitis: Secondary | ICD-10-CM | POA: Diagnosis present

## 2020-04-08 DIAGNOSIS — F329 Major depressive disorder, single episode, unspecified: Secondary | ICD-10-CM | POA: Diagnosis present

## 2020-04-08 DIAGNOSIS — I10 Essential (primary) hypertension: Secondary | ICD-10-CM | POA: Diagnosis present

## 2020-04-08 DIAGNOSIS — N2889 Other specified disorders of kidney and ureter: Secondary | ICD-10-CM | POA: Diagnosis present

## 2020-04-08 DIAGNOSIS — E669 Obesity, unspecified: Secondary | ICD-10-CM

## 2020-04-08 DIAGNOSIS — D3501 Benign neoplasm of right adrenal gland: Secondary | ICD-10-CM | POA: Diagnosis present

## 2020-04-08 DIAGNOSIS — Z794 Long term (current) use of insulin: Secondary | ICD-10-CM | POA: Diagnosis not present

## 2020-04-08 LAB — CBC
HCT: 43.5 % (ref 36.0–46.0)
Hemoglobin: 14 g/dL (ref 12.0–15.0)
MCH: 30.9 pg (ref 26.0–34.0)
MCHC: 32.2 g/dL (ref 30.0–36.0)
MCV: 96 fL (ref 80.0–100.0)
Platelets: 274 10*3/uL (ref 150–400)
RBC: 4.53 MIL/uL (ref 3.87–5.11)
RDW: 13.1 % (ref 11.5–15.5)
WBC: 13 10*3/uL — ABNORMAL HIGH (ref 4.0–10.5)
nRBC: 0 % (ref 0.0–0.2)

## 2020-04-08 LAB — GLUCOSE, CAPILLARY
Glucose-Capillary: 170 mg/dL — ABNORMAL HIGH (ref 70–99)
Glucose-Capillary: 202 mg/dL — ABNORMAL HIGH (ref 70–99)
Glucose-Capillary: 109 mg/dL — ABNORMAL HIGH (ref 70–99)

## 2020-04-08 MED ORDER — MAGIC MOUTHWASH
15.0000 mL | Freq: Four times a day (QID) | ORAL | Status: DC | PRN
  Filled 2020-04-08: qty 15

## 2020-04-08 MED ORDER — BISACODYL 10 MG RE SUPP: 10.0000 mg | Freq: Two times a day (BID) | RECTAL | Status: DC | PRN

## 2020-04-08 MED ORDER — METHOCARBAMOL 500 MG PO TABS
750.0000 mg | ORAL_TABLET | Freq: Four times a day (QID) | ORAL | Status: DC
  Administered 2020-04-08 (×4): 750 mg via ORAL
  Filled 2020-04-08 (×4): qty 2

## 2020-04-08 MED ORDER — SIMETHICONE 40 MG/0.6ML PO SUSP
40.0000 mg | Freq: Four times a day (QID) | ORAL | Status: DC | PRN
  Filled 2020-04-08: qty 0.6

## 2020-04-08 MED ORDER — PHENOL 1.4 % MT LIQD
2.0000 | OROMUCOSAL | Status: DC | PRN
  Filled 2020-04-08: qty 177

## 2020-04-08 MED ORDER — PROMETHAZINE HCL 25 MG PO TABS: 25.0000 mg | ORAL_TABLET | Freq: Two times a day (BID) | ORAL | Status: DC | PRN

## 2020-04-08 MED ORDER — ENALAPRILAT 1.25 MG/ML IV SOLN
0.6250 mg | Freq: Four times a day (QID) | INTRAVENOUS | Status: DC | PRN
  Filled 2020-04-08: qty 1

## 2020-04-08 MED ORDER — LIP MEDEX EX OINT
1.0000 "application " | TOPICAL_OINTMENT | Freq: Two times a day (BID) | CUTANEOUS | Status: DC
  Administered 2020-04-08 – 2020-04-10 (×5): 1 via TOPICAL

## 2020-04-08 MED ORDER — INSULIN DETEMIR 100 UNIT/ML ~~LOC~~ SOLN: 70.0000 [IU] | Freq: Every day | SUBCUTANEOUS | Status: DC

## 2020-04-08 MED ORDER — POLYETHYLENE GLYCOL 3350 17 G PO PACK
17.0000 g | PACK | Freq: Two times a day (BID) | ORAL | Status: DC
  Administered 2020-04-08 (×2): 17 g via ORAL
  Filled 2020-04-08 (×2): qty 1

## 2020-04-08 MED ORDER — ATORVASTATIN CALCIUM 40 MG PO TABS
80.0000 mg | ORAL_TABLET | Freq: Every day | ORAL | Status: DC
  Administered 2020-04-08 – 2020-04-10 (×3): 80 mg via ORAL
  Filled 2020-04-08 (×3): qty 2

## 2020-04-08 MED ORDER — ESTRADIOL 1 MG PO TABS
1.0000 mg | ORAL_TABLET | Freq: Every day | ORAL | Status: DC
  Administered 2020-04-08 – 2020-04-10 (×3): 1 mg via ORAL
  Filled 2020-04-08 (×3): qty 1

## 2020-04-08 MED ORDER — SODIUM CHLORIDE 0.9% FLUSH
3.0000 mL | Freq: Two times a day (BID) | INTRAVENOUS | Status: DC
  Administered 2020-04-08 – 2020-04-10 (×4): 3 mL via INTRAVENOUS

## 2020-04-08 MED ORDER — GLIMEPIRIDE 4 MG PO TABS
8.0000 mg | ORAL_TABLET | Freq: Every day | ORAL | Status: DC
  Administered 2020-04-08 – 2020-04-10 (×3): 8 mg via ORAL
  Filled 2020-04-08 (×3): qty 2

## 2020-04-08 MED ORDER — FLUTICASONE PROPIONATE 50 MCG/ACT NA SUSP
1.0000 | Freq: Every day | NASAL | Status: DC
  Administered 2020-04-08 – 2020-04-10 (×3): 1 via NASAL
  Filled 2020-04-08: qty 16

## 2020-04-08 MED ORDER — HYDROCODONE-ACETAMINOPHEN 10-325 MG PO TABS
1.0000 | ORAL_TABLET | ORAL | Status: DC | PRN
  Administered 2020-04-08 – 2020-04-09 (×3): 2 via ORAL
  Filled 2020-04-08 (×3): qty 2

## 2020-04-08 MED ORDER — OXYCODONE HCL 5 MG PO TABS: 5.0000 mg | ORAL_TABLET | ORAL | Status: DC | PRN

## 2020-04-08 MED ORDER — ALUM & MAG HYDROXIDE-SIMETH 200-200-20 MG/5ML PO SUSP: 30.0000 mL | Freq: Four times a day (QID) | ORAL | Status: DC | PRN

## 2020-04-08 MED ORDER — SODIUM CHLORIDE 0.9% FLUSH: 3.0000 mL | INTRAVENOUS | Status: DC | PRN

## 2020-04-08 MED ORDER — ACETAMINOPHEN 500 MG PO TABS: 1000.0000 mg | ORAL_TABLET | Freq: Three times a day (TID) | ORAL | Status: DC

## 2020-04-08 MED ORDER — DIPHENHYDRAMINE HCL 50 MG/ML IJ SOLN: 12.5000 mg | Freq: Four times a day (QID) | INTRAMUSCULAR | Status: DC | PRN

## 2020-04-08 MED ORDER — INSULIN DETEMIR 100 UNIT/ML ~~LOC~~ SOLN
25.0000 [IU] | Freq: Every day | SUBCUTANEOUS | Status: DC
  Administered 2020-04-08: 25 [IU] via SUBCUTANEOUS
  Filled 2020-04-08 (×2): qty 0.25

## 2020-04-08 MED ORDER — KETOROLAC TROMETHAMINE 15 MG/ML IJ SOLN: 15.0000 mg | Freq: Four times a day (QID) | INTRAMUSCULAR | Status: DC | PRN

## 2020-04-08 MED ORDER — METHOCARBAMOL 1000 MG/10ML IJ SOLN
1000.0000 mg | Freq: Four times a day (QID) | INTRAVENOUS | Status: DC | PRN
  Filled 2020-04-08: qty 10

## 2020-04-08 MED ORDER — SODIUM CHLORIDE 0.9 % IV SOLN: 250.0000 mL | INTRAVENOUS | Status: DC | PRN

## 2020-04-08 MED ORDER — LACTATED RINGERS IV BOLUS: 1000.0000 mL | Freq: Three times a day (TID) | INTRAVENOUS | Status: AC | PRN

## 2020-04-08 MED ORDER — MENTHOL 3 MG MT LOZG: 1.0000 | LOZENGE | OROMUCOSAL | Status: DC | PRN

## 2020-04-08 NOTE — Progress Notes (Addendum)
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    Assessment  Struggling with pain control  [facility] Stay = 1 days)  Plan:  -Improved pain control.  Try and increase oral control. -Home oral hypoglycemics as well as Levemir with sliding scale insulin for diabetes. -Hypertension control. -GERD control -VTE prophylaxis- SCDs, etc -mobilize as tolerated to help recovery  D/C patient from hospital when patient meets criteria (anticipate in 1-2 day(s)):  Tolerating oral intake well Ambulating well Adequate pain control without IV medications Urinating  Having flatus Disposition planning in place   25 minutes spent in review, evaluation, examination, counseling, and coordination of care.  More than 50% of that time was spent in counseling.  04/08/2020    Subjective: (Chief complaint)  Very sore.  Needing IV medications.  Try to mobilize.  Mild nausea but no emesis.  Objective:  Vital signs:  Vitals:   04/07/20 1820  04/07/20 1936 04/08/20 0136 04/08/20 0507  BP: (!) 145/82 (!) 143/81 132/69 119/78  Pulse: 76 83 74 82  Resp: 17 18 16 18   Temp: (!) 97.4 F (36.3 C) 98.2 F (36.8 C) 98.2 F (36.8 C) 98.2 F (36.8 C)  TempSrc:      SpO2: 97% 92% 96% 97%  Weight:      Height:           Intake/Output   Yesterday:  04/30 0701 - 05/01 0700 In: 2400 [P.O.:1000; I.V.:1100; IV Piggyback:300] Out: 2550 [Urine:2450; Blood:100] This shift:  No intake/output data recorded.  Bowel function:  Flatus: YES  BM:  No  Drain: (No drain)   Physical Exam:  General: Pt awake/alert in mild acute distress.  Not toxic but obviously uncomfortable Eyes: PERRL, normal EOM.  Sclera clear.  No icterus Neuro: CN II-XII intact w/o focal sensory/motor deficits. Lymph: No head/neck/groin lymphadenopathy Psych:  No delerium/psychosis/paranoia.  Oriented x 4 HENT: Normocephalic, Mucus membranes moist.  No thrush Neck: Supple, No tracheal deviation.  No obvious thyromegaly Chest: No pain to chest wall compression.  Good respiratory excursion.  No audible wheezing CV:  Pulses intact.  Regular rhythm.  No major extremity edema MS: Normal AROM mjr joints.  No obvious deformity  Abdomen: Soft.  Nondistended.  Mildly tender at incisions only.  No evidence of peritonitis.  No incarcerated hernias.  Ext:  No deformity.  No mjr edema.  No cyanosis Skin: No petechiae / purpurea.  No major sores.  Warm and dry    Results:   Cultures: Recent Results (from the past 720 hour(s))  SARS CORONAVIRUS  2 (TAT 6-24 HRS) Nasopharyngeal Nasopharyngeal Swab     Status: None   Collection Time: 04/04/20  1:27 PM   Specimen: Nasopharyngeal Swab  Result Value Ref Range Status   SARS Coronavirus 2 NEGATIVE NEGATIVE Final    Comment: (NOTE) SARS-CoV-2 target nucleic acids are NOT DETECTED. The SARS-CoV-2 RNA is generally detectable in upper and lower respiratory specimens during the acute phase of infection. Negative results do not  preclude SARS-CoV-2 infection, do not rule out co-infections with other pathogens, and should not be used as the sole basis for treatment or other patient management decisions. Negative results must be combined with clinical observations, patient history, and epidemiological information. The expected result is Negative. Fact Sheet for Patients: SugarRoll.be Fact Sheet for Healthcare Providers: https://www.woods-mathews.com/ This test is not yet approved or cleared by the [place] FDA and  has been authorized for detection and/or diagnosis of SARS-CoV-2 by FDA under an Emergency Use Authorization (EUA). This EUA will remain  in effect (meaning this test can be used) for the duration of the COVID-19 declaration under Section 56 4(b)(1) of the Act, 21 U.S.C. section 360bbb-3(b)(1), unless the authorization is terminated or revoked sooner. Performed at [facility], [place] [address], [place], [place] [address]     Labs: Results for orders placed or performed during the hospital encounter of 04/07/20 (from the past 48 hour(s))  Glucose, capillary     Status: Abnormal   Collection Time: 04/07/20 12:30 PM  Result Value Ref Range   Glucose-Capillary 147 (H) 70 - 99 mg/dL    Comment: Glucose reference range applies only to samples taken after fasting for at least 8 hours.   Comment 1 Notify RN    Comment 2 Document in Chart   Glucose, capillary     Status: Abnormal   Collection Time: 04/07/20  3:49 PM  Result Value Ref Range   Glucose-Capillary 222 (H) 70 - 99 mg/dL    Comment: Glucose reference range applies only to samples taken after fasting for at least 8 hours.  Glucose, capillary     Status: Abnormal   Collection Time: 04/07/20  9:30 PM  Result Value Ref Range   Glucose-Capillary 289 (H) 70 - 99 mg/dL    Comment: Glucose reference range applies only to samples taken after fasting for at least 8 hours.   Comment 1 Notify RN   CBC      Status: Abnormal   Collection Time: 04/08/20  5:56 AM  Result Value Ref Range   WBC 13.0 (H) 4.0 - 10.5 K/uL   RBC 4.53 3.87 - 5.11 MIL/uL   Hemoglobin 14.0 12.0 - 15.0 g/dL   HCT 43.5 36.0 - 46.0 %   MCV 96.0 80.0 - 100.0 fL   MCH 30.9 26.0 - 34.0 pg   MCHC 32.2 30.0 - 36.0 g/dL   RDW 13.1 11.5 - 15.5 %   Platelets 274 150 - 400 K/uL   nRBC 0.0 0.0 - 0.2 %    Comment: Performed at [facility], [place] [address], [place], [place] [address]  Glucose, capillary     Status: Abnormal   Collection Time: 04/08/20  7:28 AM  Result Value Ref Range   Glucose-Capillary 170 (H) 70 - 99 mg/dL    Comment: Glucose reference range applies only to samples taken after fasting for at least 8 hours.    Imaging / Studies: No results found.  Medications / Allergies: per chart  Antibiotics: Anti-infectives (From admission, onward)  Start     Dose/Rate Route Frequency Ordered Stop   04/07/20 0600  vancomycin (VANCOREADY) IVPB 1500 mg/300 mL     1,500 mg 150 mL/hr over 120 Minutes Intravenous On call to O.R. 04/06/20 0745 04/07/20 2100        Note: Portions of this report may have been transcribed using voice recognition software. Every effort was made to ensure accuracy; however, inadvertent computerized transcription errors may be present.   Any transcriptional errors that result from this process are unintentional.     Adin Hector, MD, FACS, MASCRS Gastrointestinal and Minimally Invasive Surgery    [address] [place], [place] [address] [phone] Main / Paging [phone] Fax Please see Amion for pager number, especial 5pm - 7am.

## 2020-04-09 LAB — GLUCOSE, CAPILLARY
Glucose-Capillary: 210 mg/dL — ABNORMAL HIGH (ref 70–99)
Glucose-Capillary: 217 mg/dL — ABNORMAL HIGH (ref 70–99)
Glucose-Capillary: 145 mg/dL — ABNORMAL HIGH (ref 70–99)
Glucose-Capillary: 171 mg/dL — ABNORMAL HIGH (ref 70–99)

## 2020-04-09 MED ORDER — GABAPENTIN 300 MG PO CAPS: 300.0000 mg | ORAL_CAPSULE | Freq: Two times a day (BID) | ORAL | Status: DC

## 2020-04-09 MED ORDER — POLYETHYLENE GLYCOL 3350 17 G PO PACK
34.0000 g | PACK | Freq: Two times a day (BID) | ORAL | Status: DC
  Administered 2020-04-09 (×2): 34 g via ORAL
  Filled 2020-04-09 (×3): qty 2

## 2020-04-09 MED ORDER — INSULIN DETEMIR 100 UNIT/ML ~~LOC~~ SOLN
30.0000 [IU] | Freq: Every day | SUBCUTANEOUS | Status: DC
  Administered 2020-04-09 – 2020-04-10 (×2): 30 [IU] via SUBCUTANEOUS
  Filled 2020-04-09 (×2): qty 0.3

## 2020-04-09 MED ORDER — ACETAMINOPHEN 325 MG PO TABS: 325.0000 mg | ORAL_TABLET | Freq: Four times a day (QID) | ORAL | Status: DC | PRN

## 2020-04-09 MED ORDER — FAMOTIDINE 20 MG PO TABS: 20.0000 mg | ORAL_TABLET | Freq: Two times a day (BID) | ORAL | Status: DC

## 2020-04-09 MED ORDER — BISACODYL 10 MG RE SUPP
10.0000 mg | Freq: Every day | RECTAL | Status: DC
  Administered 2020-04-09: 10 mg via RECTAL
  Filled 2020-04-09 (×2): qty 1

## 2020-04-09 MED ORDER — ALBUTEROL SULFATE (2.5 MG/3ML) 0.083% IN NEBU
2.5000 mg | INHALATION_SOLUTION | Freq: Four times a day (QID) | RESPIRATORY_TRACT | Status: DC | PRN
  Administered 2020-04-09: 2.5 mg via RESPIRATORY_TRACT
  Filled 2020-04-09: qty 3

## 2020-04-09 MED ORDER — NICOTINE 14 MG/24HR TD PT24
14.0000 mg | MEDICATED_PATCH | Freq: Every day | TRANSDERMAL | Status: DC
  Administered 2020-04-09 – 2020-04-10 (×2): 14 mg via TRANSDERMAL
  Filled 2020-04-09 (×2): qty 1

## 2020-04-09 MED ORDER — GABAPENTIN 300 MG PO CAPS
300.0000 mg | ORAL_CAPSULE | Freq: Three times a day (TID) | ORAL | Status: DC
  Administered 2020-04-09 – 2020-04-10 (×4): 300 mg via ORAL
  Filled 2020-04-09 (×4): qty 1

## 2020-04-09 MED ORDER — HYDROCODONE-ACETAMINOPHEN 10-325 MG PO TABS
0.5000 | ORAL_TABLET | ORAL | Status: DC | PRN
  Administered 2020-04-10: 1 via ORAL
  Filled 2020-04-09: qty 1

## 2020-04-09 MED ORDER — ACETAMINOPHEN 650 MG RE SUPP: 650.0000 mg | Freq: Four times a day (QID) | RECTAL | Status: DC | PRN

## 2020-04-09 MED ORDER — METHOCARBAMOL 500 MG PO TABS
1000.0000 mg | ORAL_TABLET | Freq: Four times a day (QID) | ORAL | Status: DC
  Administered 2020-04-09 – 2020-04-10 (×5): 1000 mg via ORAL
  Filled 2020-04-09 (×5): qty 2

## 2020-04-09 MED ORDER — NAPROXEN 250 MG PO TABS
500.0000 mg | ORAL_TABLET | Freq: Two times a day (BID) | ORAL | Status: DC
  Administered 2020-04-09 – 2020-04-10 (×2): 500 mg via ORAL
  Filled 2020-04-09 (×3): qty 2

## 2020-04-09 NOTE — Progress Notes (Signed)
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 Scheduled Zofran. -Advance diet per protocol -VTE prophylaxis- SCDs, etc -mobilize as tolerated to help recovery  20 minutes spent in review, evaluation, examination, counseling, and coordination of care.  More than 50% of that time was spent in counseling.  04/09/2020    Subjective: Patient overall still having pain, but improved since yesterday.   Having nausea, especially when mobilizing. No episodes of emesis.   Patient with non-productive cough and shortness of breath. Denies chest pain. On 2L oxygen in bed. Recently quit smoking.   Has not moved bowels yet. Urinating without difficulty.   Objective:  Vital signs:  Vitals:   04/08/20 1739 04/08/20 2152 04/09/20 0201 04/09/20 0542  BP: 115/71 (!) 157/86 124/79 (!) 148/84  Pulse: 75 90 82 91  Resp:  17 18 18   Temp: 98.2 F (36.8 C) 98.1 F (36.7 C) 98.6 F (37 C) 98.1 F (36.7 C)  TempSrc: Oral Oral Oral Oral  SpO2: 95% 96% 96% 95%  Weight:      Height:           Intake/Output   Yesterday:  05/01 0701 - 05/02  0700 In: 360 [P.O.:360] Out: 700 [Urine:700] This shift:  No intake/output data recorded.  Bowel function:  Flatus: YES  BM:  No  Drain: (No drain)   Physical Exam:  General: Pt awake/alert in no acute distress Eyes: PERRL, normal EOM.  Sclera clear.  No icterus Neuro: CN II-XII intact w/o focal sensory/motor deficits. Lymph: No head/neck/groin lymphadenopathy Psych:  No delerium/psychosis/paranoia.  Oriented x 4 HENT: Normocephalic, Mucus membranes moist.  No thrush Neck: Supple, No tracheal deviation.  No obvious thyromegaly Chest: No pain to chest wall compression.  Good respiratory excursion.  No audible wheezing CV:  Pulses intact.  Regular rhythm.  No major extremity edema MS: Normal AROM mjr joints.  No obvious deformity  Abdomen: Somewhat firm.  Mildy distended.  Mildly tender at incisions only.  No evidence of peritonitis.  No incarcerated hernias.  Ext:  Incisions clean/dry/intact. No deformity.  No mjr edema.  No cyanosis Skin: No petechiae / purpurea.  No major sores.  Warm and dry    Results:   Cultures: Recent Results (from the past 720 hour(s))  SARS CORONAVIRUS 2 (TAT 6-24 HRS) Nasopharyngeal Nasopharyngeal Swab     Status: None   Collection Time: 04/04/20  1:27 PM   Specimen: Nasopharyngeal Swab  Result Value Ref Range Status   SARS Coronavirus 2 NEGATIVE NEGATIVE Final    Comment: (NOTE) SARS-CoV-2 target nucleic acids are NOT DETECTED. The SARS-CoV-2 RNA is generally detectable in upper and lower respiratory specimens during the acute  phase of infection. Negative results do not preclude SARS-CoV-2 infection, do not rule out co-infections with other pathogens, and should not be used as the sole basis for treatment or other patient management decisions. Negative results must be combined with clinical observations, patient history, and epidemiological information. The expected result is Negative. Fact Sheet for  Patients: SugarRoll.be Fact Sheet for Healthcare Providers: https://www.woods-mathews.com/ This test is not yet approved or cleared by the [place] FDA and  has been authorized for detection and/or diagnosis of SARS-CoV-2 by FDA under an Emergency Use Authorization (EUA). This EUA will remain  in effect (meaning this test can be used) for the duration of the COVID-19 declaration under Section 56 4(b)(1) of the Act, 21 U.S.C. section 360bbb-3(b)(1), unless the authorization is terminated or revoked sooner. Performed at [facility], [place] [address] [place], [place] [address]     Labs: Results for orders placed or performed during the hospital encounter of 04/07/20 (from the past 48 hour(s))  Glucose, capillary     Status: Abnormal   Collection Time: 04/07/20 12:30 PM  Result Value Ref Range   Glucose-Capillary 147 (H) 70 - 99 mg/dL    Comment: Glucose reference range applies only to samples taken after fasting for at least 8 hours.   Comment 1 Notify RN    Comment 2 Document in Chart   Glucose, capillary     Status: Abnormal   Collection Time: 04/07/20  3:49 PM  Result Value Ref Range   Glucose-Capillary 222 (H) 70 - 99 mg/dL    Comment: Glucose reference range applies only to samples taken after fasting for at least 8 hours.  Glucose, capillary     Status: Abnormal   Collection Time: 04/07/20  9:30 PM  Result Value Ref Range   Glucose-Capillary 289 (H) 70 - 99 mg/dL    Comment: Glucose reference range applies only to samples taken after fasting for at least 8 hours.   Comment 1 Notify RN   CBC     Status: Abnormal   Collection Time: 04/08/20  5:56 AM  Result Value Ref Range   WBC 13.0 (H) 4.0 - 10.5 K/uL   RBC 4.53 3.87 - 5.11 MIL/uL   Hemoglobin 14.0 12.0 - 15.0 g/dL   HCT 43.5 36.0 - 46.0 %   MCV 96.0 80.0 - 100.0 fL   MCH 30.9 26.0 - 34.0 pg   MCHC 32.2 30.0 - 36.0 g/dL   RDW 13.1 11.5 - 15.5 %   Platelets 274 150 -  400 K/uL   nRBC 0.0 0.0 - 0.2 %    Comment: Performed at [facility], [place] [address], [place], [place] [address]  Glucose, capillary     Status: Abnormal   Collection Time: 04/08/20  7:28 AM  Result Value Ref Range   Glucose-Capillary 170 (H) 70 - 99 mg/dL    Comment: Glucose reference range applies only to samples taken after fasting for at least 8 hours.  Glucose, capillary     Status: Abnormal   Collection Time: 04/08/20 11:36 AM  Result Value Ref Range   Glucose-Capillary 202 (H) 70 - 99 mg/dL    Comment: Glucose reference range applies only to samples taken after fasting for at least 8 hours.  Glucose, capillary     Status: Abnormal   Collection Time: 04/08/20  4:43 PM  Result Value Ref Range   Glucose-Capillary 109 (H) 70 - 99 mg/dL    Comment: Glucose reference range applies only  to samples taken after fasting for at least 8 hours.  Glucose, capillary     Status: Abnormal   Collection Time: 04/09/20  7:16 AM  Result Value Ref Range   Glucose-Capillary 210 (H) 70 - 99 mg/dL    Comment: Glucose reference range applies only to samples taken after fasting for at least 8 hours.    Imaging / Studies: No results found.  Medications / Allergies: per chart  Antibiotics: Anti-infectives (From admission, onward)   Start     Dose/Rate Route Frequency Ordered Stop   04/07/20 0600  vancomycin (VANCOREADY) IVPB 1500 mg/300 mL     1,500 mg 150 mL/hr over 120 Minutes Intravenous On call to O.R. 04/06/20 0745 04/07/20 2100        Note: Portions of this report may have been transcribed using voice recognition software. Every effort was made to ensure accuracy; however, inadvertent computerized transcription errors may be present.   Any transcriptional errors that result from this process are unintentional.     Valerie Pyo, PA-S Patient was seen and evaluated in coordination with Dr.Steven Gross, MD.     [address], [place], [place]  [address] [phone] Main / Paging [phone] Fax Please see Amion for pager number, especial 5pm - 7am.

## 2020-04-10 LAB — GLUCOSE, CAPILLARY
Glucose-Capillary: 99 mg/dL (ref 70–99)
Glucose-Capillary: 158 mg/dL — ABNORMAL HIGH (ref 70–99)

## 2020-04-10 LAB — SURGICAL PATHOLOGY

## 2020-04-10 NOTE — Discharge Summary (Signed)
Physician Discharge Summary  Patient ID: Valerie Diaz MRN: [id number] DOB/AGE: May 21, 1966 54 y.o.  Admit date: 04/07/2020 Discharge date: 04/10/2020  Admission Diagnoses: right adrenal adenoma  Discharge Diagnoses:  Principal Problem:   Adrenal adenoma s/p robotic right adrenalectomy 04/07/2020 Active Problems:   H/O partial adrenalectomy ([place])   Bipolar 1 disorder (HCC)   Smoker   Back pain   Hypertension   GERD (gastroesophageal reflux disease)   Insulin-requiring or dependent type II diabetes mellitus (HCC)   Obesity (BMI 30-39.9)   Discharged Condition: good  Hospital Course: Patient 49F with R adrenal incidentaloma.  Pt underwent robotic right adrenalectomy.  Pt did well aside from expected pain issues.  She was tol PO well.  She was ambulating well and having BMs.  She was deemed stable for DC and DC home.  Consults: None  Significant Diagnostic Studies: none  Treatments: surgery: as above  Discharge Exam: Blood pressure 131/84, pulse 75, temperature 97.9 F (36.6 C), temperature source Oral, resp. rate 18, height 5\' 5"  (1.651 m), weight 98.1 kg, last menstrual period 08/15/2012, SpO2 91 %. General appearance: alert and cooperative Incision/Wound: inc c/d/i   Disposition: Discharge disposition: 01-Home or Self Care       Discharge Instructions    Diet - low sodium heart healthy   Complete by: As directed    Increase activity slowly   Complete by: As directed      Allergies as of 04/10/2020      Reactions   Penicillins Anaphylaxis, Hives   Did it involve swelling of the face/tongue/throat, SOB, or low BP? yes Did it involve sudden or severe rash/hives, skin peeling, or any reaction on the inside of your mouth or nose? Yes Did you need to seek medical attention at a hospital or doctor's office? Yes When did it last happen?childhood  If all above answers are "NO", may proceed with cephalosporin use.   Shellfish Allergy Hives, Shortness Of Breath    Throat swelling      Medication List    TAKE these medications   atorvastatin 80 MG tablet Commonly known as: LIPITOR Take 80 mg by mouth daily.   estradiol 1 MG tablet Commonly known as: ESTRACE Take 1 mg by mouth daily.   fluticasone 50 MCG/ACT nasal spray Commonly known as: FLONASE Place 1 spray into both nostrils daily.   Gas Relief 180 MG Caps Generic drug: Simethicone Take 1 capsule by mouth daily.   glimepiride 4 MG tablet Commonly known as: AMARYL Take 8 mg by mouth daily with breakfast.   HYDROcodone-acetaminophen 10-325 MG tablet Commonly known as: NORCO Take 1 tablet by mouth every 12 (twelve) hours as needed for severe pain.   insulin detemir 100 UNIT/ML injection Commonly known as: LEVEMIR Inject 70 Units into the skin daily.   insulin lispro 100 UNIT/ML injection Commonly known as: HUMALOG Inject 12 Units into the skin 3 (three) times daily before meals. Sliding scale   lisinopril 5 MG tablet Commonly known as: ZESTRIL Take 5 mg by mouth daily.   omeprazole 40 MG capsule Commonly known as: PRILOSEC Take 40 mg by mouth daily.   OneTouch Delica Lancets [id number] Misc 1 each by Does not apply route 4 (four) times daily.   ONETOUCH TEST VI 1 each by Other route 4 (four) times daily.   Ozempic (0.25 or 0.5 MG/DOSE) 2 MG/1.5ML Sopn Generic drug: Semaglutide(0.25 or 0.5MG /DOS) Inject 0.5 mg into the skin once a week.   Probiotic 250 MG Caps Take 1 tablet  by mouth daily.   promethazine 25 MG tablet Commonly known as: PHENERGAN Take 25 mg by mouth every 12 (twelve) hours as needed for nausea or vomiting.   sitaGLIPtin 100 MG tablet Commonly known as: JANUVIA Take 100 mg by mouth daily. Not taking   Stool Softener 100 MG capsule Generic drug: docusate sodium Take 100 mg by mouth 4 (four) times daily.        Signed: Ralene Ok 04/10/2020, 6:53 AM

## 2020-04-11 NOTE — Progress Notes (Addendum)
Pt had gotten up to use BR when previous VS taken. Alert and Ox4. VS rechecked at resting state.

## 2020-06-02 ENCOUNTER — Encounter: Payer: Self-pay | Admitting: Internal Medicine

## 2020-06-02 ENCOUNTER — Other Ambulatory Visit: Payer: Self-pay

## 2020-06-02 ENCOUNTER — Ambulatory Visit (INDEPENDENT_AMBULATORY_CARE_PROVIDER_SITE_OTHER): Payer: Medicare Other | Admitting: Internal Medicine

## 2020-06-02 VITALS — BP 122/80 | HR 91 | Ht 65.0 in | Wt 208.0 lb

## 2020-06-02 DIAGNOSIS — Z794 Long term (current) use of insulin: Secondary | ICD-10-CM | POA: Diagnosis not present

## 2020-06-02 DIAGNOSIS — E896 Postprocedural adrenocortical (-medullary) hypofunction: Secondary | ICD-10-CM | POA: Diagnosis not present

## 2020-06-02 DIAGNOSIS — D3501 Benign neoplasm of right adrenal gland: Secondary | ICD-10-CM

## 2020-06-02 DIAGNOSIS — E1165 Type 2 diabetes mellitus with hyperglycemia: Secondary | ICD-10-CM

## 2020-06-02 LAB — CORTISOL: Cortisol, Plasma: 6.4 ug/dL

## 2020-06-02 NOTE — Progress Notes (Signed)
Patient ID: Valerie Diaz, female   DOB: 1966-12-09, 54 y.o.   MRN: [id number]   This visit occurred during the SARS-CoV-2 public health emergency.  Safety protocols were in place, including screening questions prior to the visit, additional usage of staff PPE, and extensive cleaning of exam room while observing appropriate contact time as indicated for disinfecting solutions.   HPI: Valerie Diaz is a 54 y.o.-year-old female, initially referred by her PCP, Dr. York Diaz, for management of DM2, dx in her 52s, insulin-dependent since 08/2019, uncontrolled, with long-term complications (+ DR).  She had a separate referral from Dr. Ralene Diaz for R adrenal incidentaloma.  Last visit 2.5 months ago.  She quit smoking after her adrenal surgery.   She started to walk 30 min after each meal. She would like to lose weight. She will start GoLo (Mg + Chromium + Zinc + Berberine).  DM2:  Reviewed her HbA1c levels: 05/09/2020: HbA1c 7.1% Lab Results  Component Value Date   HGBA1C 9.0 (H) 04/04/2020   HGBA1C 9.7 (A) 03/24/2020   HGBA1C 10.4 (A) 01/10/2020  05/31/2019: HbA1c 9.9%  Pt is on a regimen of: - Glimepiride 8 mg before breakfast  - Januvia 100 mg before breakfast >> Ozempic 0.5 mg weekly - Levemir 35 >> 60 units at bedtime >> 20 units in a.m. and 50 units at bedtime  - Lispro: 10   She was on Trulicity and Ozempic before >> neck swelling, nausea, vomiting. Now tolerating well Ozempic except for constipation.  Pt checks her sugars 8 times a day per her excellent log: - am: 151-250 >> 140-287, 330, 375 >> 98-143, 170 - 2h after b'fast: n/c >> 142-279 >> 99-171, 210 - before lunch: 200-300s >> 121-280, 325 >> 55-179, 230 - 2h after lunch: n/c >> 172-247 >> 108-201, 213 - before dinner: 230-290 >> 131-255, 304 >> 86-146, 207,  213 - 2h after dinner: n/c >> 73, 145-270 >> 95-163, 180 - bedtime: 200-300s >> 151-310 >> 103-185, 228 - nighttime: n/c Lowest sugar was 151 >>  73 >> 55; ? hypoglycemia awareness.  Highest sugar was 300s >> 300s >> 200s.  Glucometer: AccuChek [employer]  Pt's meals are: - Breakfast: Sometimes skips - Lunch: Eats  more frequently now - Dinner: meat and veggies - Snacks: one a day  -No CKD, last BUN/creatinine:  Lab Results  Component Value Date   BUN 6 04/04/2020   BUN 8 08/20/2012   CREATININE 0.55 04/04/2020   CREATININE 0.53 08/20/2012  On lisinopril 5  -+ HL; last set of lipids: 07/2019: will need records No results found for: CHOL, HDL, LDLCALC, LDLDIRECT, TRIG, CHOLHDL On atorvastatin 80 - started 07/2019.  - last eye exam was in 04/27/2020: + DR reportedly. Needs surgery for "lazy eye".  She had the surgery before for this.  -No numbness and tingling in her feet.  Pt has FH of DM in mother and sister, also MGM and PGM, F - borderline.  R adrenal incidentaloma -Discovered incidentally after she presented to the emergency room for constipation.  A right adrenal mass was seen on CT.  4 months ago we ruled out adrenal hypersecretion (no pheochromocytoma, primary hyperaldosteronism, or Cushing syndrome):   Component     Latest Ref Rng & Units 01/10/2020  Epinephrine     pg/mL <20  Norepinephrine     pg/mL 650  Dopamine     pg/mL 19  Total Catecholamines     pg/mL 669  ALDOSTERONE     *  ng/dL 6  Renin Activity     0.25 - 5.82 ng/mL/h 6.79 (H)  ALDO / PRA Ratio     0.9 - 28.9 Ratio 0.9  Metanephrine, Pl     <=57 pg/mL <25  Normetanephrine, Pl     <=148 pg/mL 111  Total Metanephrines-Plasma     <=205 pg/mL 111  Potassium     3.5 - 5.1 mEq/L 4.0   Component     Latest Ref Rng & Units 02/03/2020  Cortisol - AM     mcg/dL 2.3 (L)   I do not have records of her previous imaging tests, but the mass measured 4.1 cm.  He advises in the leg 04/07/2020 with Dr. Rosendo Diaz.  Pathology showed a benign hyperplastic adrenal nodule.  She has a history of MDD (she mentions that her bipolar diagnosis was erroneous),  history of cholecystectomy, multiple back surgeries, GERD.  ROS: Constitutional: no weight gain/no weight loss, + fatigue, + subjective hyperthermia and hypothermia Eyes: no blurry vision, no xerophthalmia ENT: no sore throat, no nodules palpated in neck, no dysphagia, no odynophagia, no hoarseness Cardiovascular: no CP/no SOB/no palpitations/+ leg swelling Respiratory: no cough/no SOB/no wheezing Gastrointestinal: no N/no V/no D/+ C/no acid reflux Musculoskeletal: + Muscle aches/+ joint aches Skin: no rashes, no hair loss Neurological: no tremors/no numbness/no tingling/no dizziness  I reviewed pt's medications, allergies, PMH, social hx, family hx, and changes were documented in the history of present illness. Otherwise, unchanged from my initial visit note.  Past Medical History:  Diagnosis Date  . Arthritis    back, Bilaterals elbows and knees  . Bipolar disorder ([place])   . Bronchitis    Hospitalized   . Bulging disc   . Cervical cancer ([place])   . Depression   . Diabetes mellitus   . Endometriosis   . GERD (gastroesophageal reflux disease)   . History of iron deficiency anemia   . History of migraine   . Hypertension   . Pneumonia    age 62 years  . Scoliosis    Past Surgical History:  Procedure Laterality Date  . ABDOMINAL HYSTERECTOMY  08/25/2012   Procedure: HYSTERECTOMY ABDOMINAL;  Surgeon: Valerie Height, MD;  Location: [facility];  Service: Gynecology;  Laterality: N/A;  . Cervical cancer surgery    . CHOLECYSTECTOMY  1992  . ELBOW SURGERY     fix tendon on left arm  . low back surgery     x 3  . ROBOTIC ADRENALECTOMY Right 04/07/2020   Procedure: XI ROBOTIC RIGHT ADRENALECTOMY;  Surgeon: Valerie Ok, MD;  Location: [facility];  Service: General;  Laterality: Right;  . SALPINGOOPHORECTOMY  08/25/2012   Procedure: SALPINGO OOPHERECTOMY;  Surgeon: Valerie Height, MD;  Location: [facility];  Service: Gynecology;  Laterality: Bilateral;   Social History   Socioeconomic History   . Marital status: Married    Spouse name: Not on file  . Number of children: 0, 1 adopted  . Years of education: Not on file  . Highest education level: Not on file  Occupational History  .  Disabled  Tobacco Use  . Smoking status: Never Smoker  . Smokeless tobacco: Never Used  Substance and Sexual Activity  . Alcohol use: No  . Drug use: No  . Sexual activity: Yes  Other Topics Concern  . Not on file  Social History Narrative  . Not on file   Social Determinants of Health   Financial Resource Strain:   . Difficulty of Paying Living Expenses: Not on  file  Food Insecurity:   . Worried About Charity fundraiser in the Last Year: Not on file  . Ran Out of Food in the Last Year: Not on file  Transportation Needs:   . Lack of Transportation (Medical): Not on file  . Lack of Transportation (Non-Medical): Not on file  Physical Activity:   . Days of Exercise per Week: Not on file  . Minutes of Exercise per Session: Not on file  Stress:   . Feeling of Stress : Not on file  Social Connections:   . Frequency of Communication with Friends and Family: Not on file  . Frequency of Social Gatherings with Friends and Family: Not on file  . Attends Religious Services: Not on file  . Active Member of Clubs or Organizations: Not on file  . Attends Archivist Meetings: Not on file  . Marital Status: Not on file  Intimate Partner Violence:   . Fear of Current or Ex-Partner: Not on file  . Emotionally Abused: Not on file  . Physically Abused: Not on file  . Sexually Abused: Not on file   Current Outpatient Medications on File Prior to Visit  Medication Sig Dispense Refill  . atorvastatin (LIPITOR) 80 MG tablet Take 80 mg by mouth daily.    Marland Kitchen docusate sodium (STOOL SOFTENER) 100 MG capsule Take 100 mg by mouth 4 (four) times daily.    Marland Kitchen estradiol (ESTRACE) 1 MG tablet Take 1 mg by mouth daily.    . fluticasone (FLONASE) 50 MCG/ACT nasal spray Place 1 spray into both nostrils  daily.    Marland Kitchen glimepiride (AMARYL) 4 MG tablet Take 8 mg by mouth daily with breakfast.     . Glucose Blood (ONETOUCH TEST VI) 1 each by Other route 4 (four) times daily.    Marland Kitchen HYDROcodone-acetaminophen (NORCO) 10-325 MG tablet Take 1 tablet by mouth every 12 (twelve) hours as needed for severe pain.     Marland Kitchen insulin detemir (LEVEMIR) 100 UNIT/ML injection Inject 70 Units into the skin daily.    . insulin lispro (HUMALOG) 100 UNIT/ML injection Inject 12 Units into the skin 3 (three) times daily before meals. Sliding scale    . lisinopril (ZESTRIL) 5 MG tablet Take 5 mg by mouth daily.    Marland Kitchen omeprazole (PRILOSEC) 40 MG capsule Take 40 mg by mouth daily.    Glory Rosebush Delica Lancets 66A MISC 1 each by Does not apply route 4 (four) times daily.    . promethazine (PHENERGAN) 25 MG tablet Take 25 mg by mouth every 12 (twelve) hours as needed for nausea or vomiting.    . Saccharomyces boulardii (PROBIOTIC) 250 MG CAPS Take 1 tablet by mouth daily.    . Semaglutide,0.25 or 0.5MG /DOS, (OZEMPIC, 0.25 OR 0.5 MG/DOSE,) 2 MG/1.5ML SOPN Inject 0.5 mg into the skin once a week. 2 pen 5  . Simethicone (GAS RELIEF) 180 MG CAPS Take 1 capsule by mouth daily.    . sitaGLIPtin (JANUVIA) 100 MG tablet Take 100 mg by mouth daily. Not taking     No current facility-administered medications on file prior to visit.   Allergies  Allergen Reactions  . Penicillins Anaphylaxis and Hives    Did it involve swelling of the face/tongue/throat, SOB, or low BP? yes Did it involve sudden or severe rash/hives, skin peeling, or any reaction on the inside of your mouth or nose? Yes Did you need to seek medical attention at a hospital or doctor's office? Yes When did it  last happen?childhood  If all above answers are "NO", may proceed with cephalosporin use.   . Shellfish Allergy Hives and Shortness Of Breath    Throat swelling   Family History  Problem Relation Age of Onset  . Anesthesia problems Neg Hx     PE: BP  122/80   Pulse 91   Ht 5\' 5"  (1.651 m)   Wt 208 lb (94.3 kg)   LMP 08/15/2012   SpO2 96%   BMI 34.61 kg/m  Wt Readings from Last 3 Encounters:  06/02/20 208 lb (94.3 kg)  04/07/20 216 lb 4.3 oz (98.1 kg)  04/04/20 216 lb 3 oz (98.1 kg)   Constitutional: overweight, in NAD Eyes: PERRLA, EOMI, no exophthalmos ENT: moist mucous membranes, no thyromegaly, no cervical lymphadenopathy Cardiovascular: RRR, No MRG Respiratory: CTA B Gastrointestinal: abdomen soft, NT, ND, BS+ Musculoskeletal: no deformities, strength intact in all 4 Skin: moist, warm, no rashes Neurological: no tremor with outstretched hands, DTR normal in all 4  ASSESSMENT: 1. DM2, insulin-dependent, uncontrolled, without long-term complications - + DR  2. R adrenal incidentaloma  PLAN:  1. Patient with longstanding, uncontrolled, type 2 diabetes, on medication regimen with sulfonylurea, SGLT 2 inhibitor, basal/bolus insulin regimen, to which we added a weekly GLP-1 receptor agonist at last visit, change from a DPP 4 inhibitor.  At that time, sugars was slightly better but still high we were pressured to improve her blood sugars before her adrenal surgery.  There was no consistent pattern in her blood sugars but sugars were high before meals and were not increasing significantly after meals.  However, she was skipping the dose of lispro if the sugars were normal before meals and advised her not to do so therefore, we did not change her mealtime lispro doses but we adjusted her sliding scale.  At that time, I advised her to give Ozempic another try.  She used this in the past but felt that her neck lymph nodes increased on the medication.  I did advise her to start Januvia when starting Metformin.  We also split Levemir into 2 doses with a lower dose in the morning and a higher dose in that time.  I also gave her a sample to see if this works better for her than lispro, as she was concerned about the latter not be very  effective for her. -At this visit, sugars are much better, after adding Ozempic and also after her surgery.  She is tolerating this well except for constipation.  She has some bloating after meals, therefore, I would like to avoid increasing the Ozempic for now.  She was able to decrease the doses of her insulin after addition of Ozempic.  She is taking between 0 and 30 units of Levemir at night.  I advised her that she needs to take it daily even though she takes a lower dose to avoid fluctuations in the blood sugars.  I advised her to take 25 units at bedtime.  This is a significant decrease since last visit.  She is also taking lower doses of lispro, between 0 and 8 units, which we will continue.  I strongly advised her to take this approximately 15 minutes before a meal, and not often.  She is not using the sliding scale. -At next visit, if the sugars remain controlled, I would like to stop treatment -I advised her to check some sugars before meals and at bedtime, also - I suggested to:  Patient Instructions  Please continue: -  Glimepiride 8 mg before breakfast  - Ozempic 0.5 mg weekly  Please use: - Levemir 25 units at bedtime - Lispro mealtime insulin: 2-8 units before meals  Check sugars before meals and at bedtime.  Please return in 3 months with your sugar log.   - advised to check sugars at different times of the day - 3x a day, rotating check times - advised for yearly eye exams >> she is UTD - return to clinic in 3-4 months  2. R adrenal incidentaloma -Patient has a history of a right adrenal adenoma, measuring more than 4 cm -We checked her adrenal hormone production and she had normal catecholamines, metanephrines, dexamethasone suppression test, and aldosterone level -She had right robotic adrenalectomy on 04/07/2020-Dr. Rosendo Diaz. -She is doing well after the surgery -We will need to check a cortisol and ACTH level.  Since her appointment is in the afternoon, I did advise her  that we may need to repeat them in the morning if they are too low at today's check.  Component     Latest Ref Rng & Units 06/02/2020  Cortisol, Plasma     ug/dL 6.4  C206 ACTH     6 - 50 pg/mL 44   Normal pm cortisol and ACTH level.  Philemon Kingdom, MD PhD [facility] Endocrinology

## 2020-06-02 NOTE — Patient Instructions (Addendum)
Please continue: - Glimepiride 8 mg before breakfast  - Ozempic 0.5 mg weekly  Please use: - Levemir 25 units at bedtime - Lispro mealtime insulin: 2-8 units before meals  Check sugars before meals and at bedtime.  Please return in 3 months with your sugar log.

## 2020-06-05 LAB — ACTH: C206 ACTH: 44 pg/mL (ref 6–50)

## 2020-09-07 ENCOUNTER — Other Ambulatory Visit: Payer: Self-pay

## 2020-09-07 ENCOUNTER — Encounter: Payer: Self-pay | Admitting: Internal Medicine

## 2020-09-07 ENCOUNTER — Ambulatory Visit (INDEPENDENT_AMBULATORY_CARE_PROVIDER_SITE_OTHER): Payer: Medicare Other | Admitting: Internal Medicine

## 2020-09-07 VITALS — BP 116/74 | HR 88 | Ht 65.0 in | Wt 203.0 lb

## 2020-09-07 DIAGNOSIS — Z794 Long term (current) use of insulin: Secondary | ICD-10-CM

## 2020-09-07 DIAGNOSIS — E1165 Type 2 diabetes mellitus with hyperglycemia: Secondary | ICD-10-CM

## 2020-09-07 DIAGNOSIS — D3501 Benign neoplasm of right adrenal gland: Secondary | ICD-10-CM | POA: Diagnosis not present

## 2020-09-07 LAB — POCT GLYCOSYLATED HEMOGLOBIN (HGB A1C): Hemoglobin A1C: 6.9 % — AB (ref 4.0–5.6)

## 2020-09-07 NOTE — Patient Instructions (Addendum)
Please continue: - Glimepiride 8 mg before breakfast  - Ozempic 0.5 mg weekly  Please use: - Lispro mealtime insulin: 2-8 units as needed  Please return in 4 months with your sugar log.

## 2020-09-07 NOTE — Progress Notes (Signed)
Patient ID: Valerie Diaz, female   DOB: [birth year], 54 y.o.   MRN: [id number]   This visit occurred during the SARS-CoV-2 public health emergency.  Safety protocols were in place, including screening questions prior to the visit, additional usage of staff PPE, and extensive cleaning of exam room while observing appropriate contact time as indicated for disinfecting solutions.   HPI: Valerie Diaz is a 54 y.o.-year-old female, initially referred by her PCP, Dr. York Ram, for management of DM2, dx in her 2s, insulin-dependent since 08/2019, uncontrolled, with long-term complications (+ DR).  She had a separate referral from Dr. Ralene Ok for R adrenal incidentaloma.  Last visit   At last visit she started to walk 30 minutes after each meal and she also was preparing to start [place] program including a supplement (magnesium + chromium + zinc + berberine) to help with weight loss.  This is working very well for her.  DM2:  Reviewed HbA1c levels: 05/09/2020: HbA1c 7.1% Lab Results  Component Value Date   HGBA1C 9.0 (H) 04/04/2020   HGBA1C 9.7 (A) 03/24/2020   HGBA1C 10.4 (A) 01/10/2020  05/31/2019: HbA1c 9.9%  Pt is on a regimen of: - Glimepiride 8 mg before breakfast  - Januvia 100 mg before breakfast >> Ozempic 0.5mg  weekly - Levemir 35 >> 60 units at bedtime >> 20 units in a.m. and 50 units at bedtime >> 25 units >> stopped 2/2 lows 07/12/2020 - Lispro: 10 >> 8-10 >> occasionally 2-6 units   She was on Trulicity and Ozempic before >> neck swelling, nausea, vomiting.  She is not tolerating Ozempic well except for constipation  Pt checks her sugars 4 times a day per her excellent log: - am: 151-250 >> 140-287, 330, 375 >> 98-143, 170 >> 81-134, 154, 166 - 2h after b'fast: n/c >> 142-279 >> 99-171, 210 >> n/c - before lunch: 121-280, 325 >> 55-179, 230 >> 58, 64, 76-139, 154, 163, 220 - 2h after lunch: n/c >> 172-247 >> 108-201, 213 >> n/c - before dinner: 131-255, 304  >> 86-146, 207,  213 >> 57, 74-151, 175, 213 - 2h after dinner: n/c >> 73, 145-270 >> 95-163, 180 >> n/c - bedtime: 200-300s >> 151-310 >> 103-185, 228 >> 73-153, 177, 217 - nighttime: n/c Lowest sugar was 151 >> 73 >> 55 >> 57; it is unclear at which level she has hypoglycemia awareness Highest sugar was 300s >> 300s >> 200s >> 220.  Glucometer: AccuChek [employer]iva  Pt's meals are: - Breakfast: Sometimes skips - Lunch: Eats  more frequently now - Dinner: meat and veggies - Snacks: one a day  -No CKD, last BUN/creatinine:  07/12/2020: 10/0.55 Lab Results  Component Value Date   BUN 6 04/04/2020   BUN 8 08/20/2012   CREATININE 0.55 04/04/2020   CREATININE 0.53 08/20/2012  On lisinopril 5  -+ HL; last set of lipids: 07/12/2020: 156/142/37/104  No results found for: CHOL, HDL, LDLCALC, LDLDIRECT, TRIG, CHOLHDL She was taken off atorvastatin 80 in 02/2020.  - last eye exam was in 04/2020: No DR reportedly. Needs surgery for "lazy eye".  She had the surgery before for this.  - no numbness and tingling in her feet.  Pt has FH of DM in mother and sister, also MGM and PGM, F - borderline.  R adrenal incidentaloma -Discovered incidentally after she presented to the emergency room for constipation.  A right adrenal mass was seen on CT.  We ruled out adrenal hypersecretion-no pheochromocytoma, primary hyperaldosteronism, Cushing syndrome:  Component     Latest Ref Rng & Units 01/10/2020  Epinephrine     pg/mL <20  Norepinephrine     pg/mL 650  Dopamine     pg/mL 19  Total Catecholamines     pg/mL 669  ALDOSTERONE     * ng/dL 6  Renin Activity     0.25 - 5.82 ng/mL/h 6.79 (H)  ALDO / PRA Ratio     0.9 - 28.9 Ratio 0.9  Metanephrine, Pl     <=57 pg/mL <25  Normetanephrine, Pl     <=148 pg/mL 111  Total Metanephrines-Plasma     <=205 pg/mL 111  Potassium     3.5 - 5.1 mEq/L 4.0   Component     Latest Ref Rng & Units 02/03/2020  Cortisol - AM     mcg/dL 2.3 (L)   I do  not have records of her previous imaging tests, but the mass measured 4.1 cm.  She had robotic adrenalectomy on 04/07/2020 with Dr. Rosendo Gros.  Pathology showed a benign hyperplastic adrenal nodule.  At last visit, we checked her cortisol and ACTH.  And these were normal: Component     Latest Ref Rng & Units 06/02/2020  Cortisol, Plasma     ug/dL 6.4  C206 ACTH     6 - 50 pg/mL 44   She has a history of MDD (she mentions that her bipolar diagnosis was erroneous), history of cholecystectomy, multiple back surgeries, GERD.  ROS: Constitutional: no weight gain/no weight loss, + fatigue, no subjective hyperthermia, no subjective hypothermia Eyes: no blurry vision, no xerophthalmia ENT: no sore throat, no nodules palpated in neck, no dysphagia, no odynophagia, no hoarseness Cardiovascular: no CP/no SOB/no palpitations/leg swelling Respiratory: no cough/no SOB/no wheezing Gastrointestinal: no N/no V/no D/+ C (diagnosed with IBS) now on Linzess/no acid reflux Musculoskeletal: no muscle aches/no joint aches Skin: no rashes, no hair loss Neurological: no tremors/no numbness/no tingling/no dizziness  I reviewed pt's medications, allergies, PMH, social hx, family hx, and changes were documented in the history of present illness. Otherwise, unchanged from my initial visit note.  Past Medical History:  Diagnosis Date  . Arthritis    back, Bilaterals elbows and knees  . Bipolar disorder ([place])   . Bronchitis    Hospitalized   . Bulging disc   . Cervical cancer ([place])   . Depression   . Diabetes mellitus   . Endometriosis   . GERD (gastroesophageal reflux disease)   . History of iron deficiency anemia   . History of migraine   . Hypertension   . Pneumonia    age 13 years  . Scoliosis    Past Surgical History:  Procedure Laterality Date  . ABDOMINAL HYSTERECTOMY  08/25/2012   Procedure: HYSTERECTOMY ABDOMINAL;  Surgeon: Gus Height, MD;  Location: [facility];  Service: Gynecology;  Laterality:  N/A;  . Cervical cancer surgery    . CHOLECYSTECTOMY  1992  . ELBOW SURGERY     fix tendon on left arm  . low back surgery     x 3  . ROBOTIC ADRENALECTOMY Right 04/07/2020   Procedure: XI ROBOTIC RIGHT ADRENALECTOMY;  Surgeon: Ralene Ok, MD;  Location: [facility];  Service: General;  Laterality: Right;  . SALPINGOOPHORECTOMY  08/25/2012   Procedure: SALPINGO OOPHERECTOMY;  Surgeon: Gus Height, MD;  Location: [facility];  Service: Gynecology;  Laterality: Bilateral;   Social History   Socioeconomic History  . Marital status: Married    Spouse name: Not on file  .  Number of children: 0, 1 adopted  . Years of education: Not on file  . Highest education level: Not on file  Occupational History  .  Disabled  Tobacco Use  . Smoking status: Never Smoker  . Smokeless tobacco: Never Used  Substance and Sexual Activity  . Alcohol use: No  . Drug use: No  . Sexual activity: Yes  Other Topics Concern  . Not on file  Social History Narrative  . Not on file   Social Determinants of Health   Financial Resource Strain:   . Difficulty of Paying Living Expenses: Not on file  Food Insecurity:   . Worried About Charity fundraiser in the Last Year: Not on file  . Ran Out of Food in the Last Year: Not on file  Transportation Needs:   . Lack of Transportation (Medical): Not on file  . Lack of Transportation (Non-Medical): Not on file  Physical Activity:   . Days of Exercise per Week: Not on file  . Minutes of Exercise per Session: Not on file  Stress:   . Feeling of Stress : Not on file  Social Connections:   . Frequency of Communication with Friends and Family: Not on file  . Frequency of Social Gatherings with Friends and Family: Not on file  . Attends Religious Services: Not on file  . Active Member of Clubs or Organizations: Not on file  . Attends Archivist Meetings: Not on file  . Marital Status: Not on file  Intimate Partner Violence:   . Fear of Current or  Ex-Partner: Not on file  . Emotionally Abused: Not on file  . Physically Abused: Not on file  . Sexually Abused: Not on file   Current Outpatient Medications on File Prior to Visit  Medication Sig Dispense Refill  . atorvastatin (LIPITOR) 80 MG tablet Take 80 mg by mouth daily.    Marland Kitchen docusate sodium (STOOL SOFTENER) 100 MG capsule Take 100 mg by mouth 4 (four) times daily.    Marland Kitchen estradiol (ESTRACE) 1 MG tablet Take 1 mg by mouth daily.    . fluticasone (FLONASE) 50 MCG/ACT nasal spray Place 1 spray into both nostrils daily.    Marland Kitchen glimepiride (AMARYL) 4 MG tablet Take 8 mg by mouth daily with breakfast.     . Glucose Blood (ONETOUCH TEST VI) 1 each by Other route 4 (four) times daily.    Marland Kitchen HYDROcodone-acetaminophen (NORCO) 10-325 MG tablet Take 1 tablet by mouth every 12 (twelve) hours as needed for severe pain.     Marland Kitchen insulin detemir (LEVEMIR) 100 UNIT/ML injection Inject 20-25 Units into the skin daily.     . insulin lispro (HUMALOG) 100 UNIT/ML injection Inject 8 Units into the skin 3 (three) times daily before meals. Sliding scale    . lisinopril (ZESTRIL) 5 MG tablet Take 5 mg by mouth daily.    Marland Kitchen OLANZapine (ZYPREXA) 2.5 MG tablet Take 2.5 mg by mouth at bedtime.    Marland Kitchen omeprazole (PRILOSEC) 40 MG capsule Take 40 mg by mouth daily.    Glory Rosebush Delica Lancets 81E MISC 1 each by Does not apply route 4 (four) times daily.    . promethazine (PHENERGAN) 25 MG tablet Take 25 mg by mouth every 12 (twelve) hours as needed for nausea or vomiting.    . Saccharomyces boulardii (PROBIOTIC) 250 MG CAPS Take 1 tablet by mouth daily.    . Semaglutide,0.25 or 0.5MG /DOS, (OZEMPIC, 0.25 OR 0.5 MG/DOSE,) 2 MG/1.5ML SOPN Inject  0.5 mg into the skin once a week. 2 pen 5  . Simethicone (GAS RELIEF) 180 MG CAPS Take 1 capsule by mouth daily.     No current facility-administered medications on file prior to visit.   Allergies  Allergen Reactions  . Penicillins Anaphylaxis and Hives    Did it involve swelling  of the face/tongue/throat, SOB, or low BP? yes Did it involve sudden or severe rash/hives, skin peeling, or any reaction on the inside of your mouth or nose? Yes Did you need to seek medical attention at a hospital or doctor's office? Yes When did it last happen?childhood  If all above answers are "NO", may proceed with cephalosporin use.   . Shellfish Allergy Hives and Shortness Of Breath    Throat swelling   Family History  Problem Relation Age of Onset  . Anesthesia problems Neg Hx     PE: BP 116/74 (BP Location: Left Arm, Patient Position: Sitting, Cuff Size: Large)   Pulse 88   Ht 5\' 5"  (1.651 m)   Wt 203 lb (92.1 kg)   LMP 08/15/2012   SpO2 97%   BMI 33.78 kg/m  Wt Readings from Last 3 Encounters:  09/07/20 203 lb (92.1 kg)  06/02/20 208 lb (94.3 kg)  04/07/20 216 lb 4.3 oz (98.1 kg)   Constitutional: overweight, in NAD Eyes: PERRLA, EOMI, no exophthalmos ENT: moist mucous membranes, no thyromegaly, no cervical lymphadenopathy Cardiovascular: RRR, No MRG Respiratory: CTA B Gastrointestinal: abdomen soft, NT, ND, BS+ Musculoskeletal: no deformities, strength intact in all 4 Skin: moist, warm, no rashes Neurological: no tremor with outstretched hands, DTR normal in all 4  ASSESSMENT: 1. DM2, insulin-dependent, uncontrolled, without long-term complications - + DR  2. HL  3. R adrenal incidentaloma  PLAN:  1. Patient with longstanding, uncontrolled, type 2 diabetes, on Metformin, sulfonylurea, and previously basal-bolus insulin, but she came off basal insulin since last visit due to improvement of her blood sugars after she started to lose weight.  She lost 30 pounds since last visit.  She feels good on this diet (low carb) and plans to continue.  She is only taking lispro as needed if the sugars are high, only for correction, between 2 and 8 units.  -We reviewed her excellent blood sugar log.  Sugars are usually at goal or occasionally above target, in the  form of hyperglycemic spikes with dietary indiscretions.  However, there is a significant improvement in the blood sugars, even off insulin.  Therefore, we will continue the same regimen.  At next visit, if she still needs to take lispro correction, we will increase the Ozempic dose.  For now, I would advise her to continue the current regimen. - I suggested to:  Patient Instructions  Please continue: - Glimepiride 8 mg before breakfast  - Ozempic 0.5 mg weekly - Levemir 25 units at bedtime - Lispro mealtime insulin: 2-8 units before meals  Please return in 3-4 months with your sugar log.   - we checked her HbA1c: 6.9% (improved) - advised to check sugars at different times of the day - 4x a day, rotating check times - advised for yearly eye exams >> she is UTD - return to clinic in 3-4 months  2.  Obesity class I -She lost 13 pounds in the last 6 months -She is doing a great job with this -We will continue GLP-1 receptor agonist, which should also help with weight loss  3. 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 Rosendo Gros -She had no side effects and she is feeling great after the surgery -A cortisol and ACTH levels were normal at last visit, ruling out adrenal insufficiency -No further investigation is needed for this condition.  Philemon Kingdom, MD PhD [facility] Endocr[facility]

## 2020-10-04 ENCOUNTER — Other Ambulatory Visit: Payer: Self-pay | Admitting: Internal Medicine

## 2021-01-08 ENCOUNTER — Ambulatory Visit: Payer: Medicare Other | Admitting: Internal Medicine

## 2021-01-08 NOTE — Progress Notes (Deleted)
Patient ID: Valerie Diaz, female   DOB: [birth year], 55 y.o.   MRN: [id number]   This visit occurred during the SARS-CoV-2 public health emergency.  Safety protocols were in place, including screening questions prior to the visit, additional usage of staff PPE, and extensive cleaning of exam room while observing appropriate contact time as indicated for disinfecting solutions.   HPI: Valerie Diaz is a 55 y.o.-year-old female, initially referred by her PCP, Dr. York Ram, for management of DM2, dx in her 31s, insulin-dependent since 08/2019, uncontrolled, with long-term complications (+ DR).  She had a separate referral from Dr. Ralene Ok for R adrenal incidentaloma.  Last visit was 4 months ago.  She continues with the [employer] weight loss program including a supplement (magnesium + chromium + zinc + berberine).   DM2:  Reviewed HbA1c levels: Lab Results  Component Value Date   HGBA1C 6.9 (A) 09/07/2020   HGBA1C 9.0 (H) 04/04/2020   HGBA1C 9.7 (A) 03/24/2020   HGBA1C 10.4 (A) 01/10/2020  05/09/2020: HbA1c 7.1% 05/31/2019: HbA1c 9.9%  She is on: - Glimepiride 8 mg before breakfast  - Januvia 100 mg before breakfast >> Ozempic 0.5 mg weekly - Levemir 35 >> 60 units at bedtime >> 20 units in a.m. and 50 units at bedtime >> 25 units >> stopped 2/2 lows 07/12/2020 - Lispro: 10 >> 8-10 >> occasionally 2 to 6 units   She was on Trulicity and Ozempic before >> neck swelling, nausea, vomiting.  She is currently tolerating Ozempic well except for constipation  Pt checks her sugars 4 times a day per her excellent log - am: 140-287, 330, 375 >> 98-143, 170 >> 81-134, 154, 166 - 2h after b'fast: n/c >> 142-279 >> 99-171, 210 >> n/c - before lunch: 55-179, 230 >> 58, 64, 76-139, 154, 163, 220 - 2h after lunch: n/c >> 172-247 >> 108-201, 213 >> n/c - before dinner: 86-146, 207,  213 >> 57, 74-151, 175, 213 - 2h after dinner: n/c >> 73, 145-270 >> 95-163, 180 >> n/c - bedtime:  103-185, 228 >> 73-153, 177, 217 - nighttime: n/c Lowest sugar was 55 >> 57; it is unclear at which level she has hypoglycemia awareness. Highest sugar was 300s >> ... 220.  Glucometer: AccuChek [employer]  Pt's meals are: - Breakfast: Sometimes skips - Lunch: Eats  more frequently now - Dinner: meat and veggies - Snacks: one a day  -No CKD, last BUN/creatinine:  07/12/2020: 10/0.55 Lab Results  Component Value Date   BUN 6 04/04/2020   BUN 8 08/20/2012   CREATININE 0.55 04/04/2020   CREATININE 0.53 08/20/2012  On lisinopril 5  -+ HL; last set of lipids: 07/12/2020: 156/142/37/104  No results found for: CHOL, HDL, LDLCALC, LDLDIRECT, TRIG, CHOLHDL She was taken off atorvastatin 80 in 02/2020.  - last eye exam was in 04/2020: Reportedly no DR. Needs surgery for "lazy eye".  She had another surgery before for the same condition.  -No numbness and tingling in her feet.  Pt has FH of DM in mother and sister, also MGM and PGM, F - borderline.  R adrenal incidentaloma -Discovered incidentally after she presented to the emergency room for constipation.  A right adrenal mass was seen on CT.  We ruled out adrenal hypersecretion: Component     Latest Ref Rng & Units 01/10/2020  Epinephrine     pg/mL <20  Norepinephrine     pg/mL 650  Dopamine     pg/mL 19  Total Catecholamines  pg/mL 669  ALDOSTERONE     * ng/dL 6  Renin Activity     0.25 - 5.82 ng/mL/h 6.79 (H)  ALDO / PRA Ratio     0.9 - 28.9 Ratio 0.9  Metanephrine, Pl     <=57 pg/mL <25  Normetanephrine, Pl     <=148 pg/mL 111  Total Metanephrines-Plasma     <=205 pg/mL 111  Potassium     3.5 - 5.1 mEq/L 4.0   Component     Latest Ref Rng & Units 02/03/2020  Cortisol - AM     mcg/dL 2.3 (L)   I do not have records of her previous images, but the mass measured 4.1 cm per review of Dr. Rosendo Gros notes.  She had robotic adrenalectomy on 04/07/2020 with Dr. Rosendo Gros.  Pathology showed a benign hyperplastic adrenal  nodule.  After her surgery, we checked a cortisol and ACTH.  These were normal: Component     Latest Ref Rng & Units 06/02/2020  Cortisol, Plasma     ug/dL 6.4  C206 ACTH     6 - 50 pg/mL 44   She has a history of MDD (she mentions that her bipolar diagnosis was erroneous), history of cholecystectomy, multiple back surgeries, GERD.  ROS: Constitutional: no weight gain/no weight loss, no fatigue, no subjective hyperthermia, no subjective hypothermia Eyes: no blurry vision, no xerophthalmia ENT: no sore throat, no nodules palpated in neck, no dysphagia, no odynophagia, no hoarseness Cardiovascular: no CP/no SOB/no palpitations/no leg swelling Respiratory: no cough/no SOB/no wheezing Gastrointestinal: no N/no V/no D/no C/no acid reflux Musculoskeletal: no muscle aches/no joint aches Skin: no rashes, no hair loss Neurological: no tremors/no numbness/no tingling/no dizziness  I reviewed pt's medications, allergies, PMH, social hx, family hx, and changes were documented in the history of present illness. Otherwise, unchanged from my initial visit note.  Past Medical History:  Diagnosis Date  . Arthritis    back, Bilaterals elbows and knees  . Bipolar disorder ([place])   . Bronchitis    Hospitalized   . Bulging disc   . Cervical cancer ([place])   . Depression   . Diabetes mellitus   . Endometriosis   . GERD (gastroesophageal reflux disease)   . History of iron deficiency anemia   . History of migraine   . Hypertension   . Pneumonia    age 69 years  . Scoliosis    Past Surgical History:  Procedure Laterality Date  . ABDOMINAL HYSTERECTOMY  08/25/2012   Procedure: HYSTERECTOMY ABDOMINAL;  Surgeon: Gus Height, MD;  Location: [facility];  Service: Gynecology;  Laterality: N/A;  . Cervical cancer surgery    . CHOLECYSTECTOMY  1992  . ELBOW SURGERY     fix tendon on left arm  . low back surgery     x 3  . ROBOTIC ADRENALECTOMY Right 04/07/2020   Procedure: XI ROBOTIC RIGHT ADRENALECTOMY;   Surgeon: Ralene Ok, MD;  Location: [facility];  Service: General;  Laterality: Right;  . SALPINGOOPHORECTOMY  08/25/2012   Procedure: SALPINGO OOPHERECTOMY;  Surgeon: Gus Height, MD;  Location: [facility];  Service: Gynecology;  Laterality: Bilateral;   Social History   Socioeconomic History  . Marital status: Married    Spouse name: Not on file  . Number of children: 0, 1 adopted  . Years of education: Not on file  . Highest education level: Not on file  Occupational History  .  Disabled  Tobacco Use  . Smoking status: Never Smoker  . Smokeless  tobacco: Never Used  Substance and Sexual Activity  . Alcohol use: No  . Drug use: No  . Sexual activity: Yes  Other Topics Concern  . Not on file  Social History Narrative  . Not on file   Social Determinants of Health   Financial Resource Strain:   . Difficulty of Paying Living Expenses: Not on file  Food Insecurity:   . Worried About Charity fundraiser in the Last Year: Not on file  . Ran Out of Food in the Last Year: Not on file  Transportation Needs:   . Lack of Transportation (Medical): Not on file  . Lack of Transportation (Non-Medical): Not on file  Physical Activity:   . Days of Exercise per Week: Not on file  . Minutes of Exercise per Session: Not on file  Stress:   . Feeling of Stress : Not on file  Social Connections:   . Frequency of Communication with Friends and Family: Not on file  . Frequency of Social Gatherings with Friends and Family: Not on file  . Attends Religious Services: Not on file  . Active Member of Clubs or Organizations: Not on file  . Attends Archivist Meetings: Not on file  . Marital Status: Not on file  Intimate Partner Violence:   . Fear of Current or Ex-Partner: Not on file  . Emotionally Abused: Not on file  . Physically Abused: Not on file  . Sexually Abused: Not on file   Current Outpatient Medications on File Prior to Visit  Medication Sig Dispense Refill  .  atorvastatin (LIPITOR) 80 MG tablet Take 80 mg by mouth daily.    Marland Kitchen docusate sodium (STOOL SOFTENER) 100 MG capsule Take 100 mg by mouth 4 (four) times daily.    Marland Kitchen estradiol (ESTRACE) 1 MG tablet Take 1 mg by mouth daily.    . fluticasone (FLONASE) 50 MCG/ACT nasal spray Place 1 spray into both nostrils daily.    Marland Kitchen glimepiride (AMARYL) 4 MG tablet Take 8 mg by mouth daily with breakfast.     . Glucose Blood (ONETOUCH TEST VI) 1 each by Other route 4 (four) times daily.    Marland Kitchen HYDROcodone-acetaminophen (NORCO) 10-325 MG tablet Take 1 tablet by mouth every 12 (twelve) hours as needed for severe pain.     Marland Kitchen insulin lispro (HUMALOG) 100 UNIT/ML injection Inject 8 Units into the skin 3 (three) times daily before meals. Sliding scale    . lisinopril (ZESTRIL) 5 MG tablet Take 5 mg by mouth daily.    Marland Kitchen OLANZapine (ZYPREXA) 2.5 MG tablet Take 2.5 mg by mouth at bedtime.    Marland Kitchen omeprazole (PRILOSEC) 40 MG capsule Take 40 mg by mouth daily.    Glory Rosebush Delica Lancets [id number] MISC 1 each by Does not apply route 4 (four) times daily.    Marland Kitchen OZEMPIC, 0.25 OR 0.5 MG/DOSE, 2 MG/1.5ML SOPN INJECT 0.5 MG INTO THE SKIN ONCE A WEEK 1.5 mL 4  . promethazine (PHENERGAN) 25 MG tablet Take 25 mg by mouth every 12 (twelve) hours as needed for nausea or vomiting.    . Saccharomyces boulardii (PROBIOTIC) 250 MG CAPS Take 1 tablet by mouth daily.    . Simethicone (GAS RELIEF) 180 MG CAPS Take 1 capsule by mouth daily.     No current facility-administered medications on file prior to visit.   Allergies  Allergen Reactions  . Penicillins Anaphylaxis and Hives    Did it involve swelling of the face/tongue/throat, SOB, or  low BP? yes Did it involve sudden or severe rash/hives, skin peeling, or any reaction on the inside of your mouth or nose? Yes Did you need to seek medical attention at a hospital or doctor's office? Yes When did it last happen?childhood  If all above answers are "NO", may proceed with cephalosporin  use.   . Shellfish Allergy Hives and Shortness Of Breath    Throat swelling   Family History  Problem Relation Age of Onset  . Anesthesia problems Neg Hx     PE: LMP 08/15/2012  Wt Readings from Last 3 Encounters:  09/07/20 203 lb (92.1 kg)  06/02/20 208 lb (94.3 kg)  04/07/20 216 lb 4.3 oz (98.1 kg)   Constitutional: overweight, in NAD Eyes: PERRLA, EOMI, no exophthalmos ENT: moist mucous membranes, no thyromegaly, no cervical lymphadenopathy Cardiovascular: RRR, No MRG Respiratory: CTA B Gastrointestinal: abdomen soft, NT, ND, BS+ Musculoskeletal: no deformities, strength intact in all 4 Skin: moist, warm, no rashes Neurological: no tremor with outstretched hands, DTR normal in all 4  ASSESSMENT: 1. DM2, insulin-dependent, uncontrolled, without long-term complications - + DR  2. HL  3. R adrenal incidentaloma  PLAN:  1. Patient with longstanding, uncontrolled, type 2 diabetes, on Metformin, sulfonylurea, and previously basal-bolus insulin regimen, now off insulin due to improvement of her blood sugars.  Before last visit, she lost significant amount of weight on a low-carb diet.  She was only taking lispro as needed, for correction and we discussed about stopping this since her blood sugars were very good.  There were only occasionally above target, after dietary indiscretions, but significantly improved from before.  We did discuss about possibly increasing the Ozempic dose, but we did not have to do so at last visit.  HbA1c at that time was 6.9%, improved.  - I suggested to:  Patient Instructions  Please continue: - Glimepiride 8 mg before breakfast  - Ozempic 0.5 mg weekly - Levemir 25 units at bedtime - Lispro mealtime insulin: 2-8 units before meals  Please return in 3-4 months with your sugar log.   - we checked her HbA1c: 7%  - advised to check sugars at different times of the day - 1x a day, rotating check times - advised for yearly eye exams >> she is  UTD - return to clinic in 3-4 months  2.  Obesity class I -She lost 14 pounds in the 6 months before our last visit -We will continue the GLP-1 receptor agonist which should also help with weight loss  3. S/p right adrenalectomy for R adrenal incidentaloma -She has a history of a right adrenal adenoma, measuring more than 4 cm -She had normal catecholamines, metanephrines, dexamethasone suppression test and aldosterone level, ruling out the form of producing adenoma -She is now status post right robotic adrenalectomy on 04/07/2020 by Dr. Rosendo Gros. -No side effect after the surgery -We checked an ACTH and cortisol level postop and these were normal, ruling out adrenal insufficiency.  Also, she has no symptoms suggestive of this condition. -No further investigation is needed for this   Philemon Kingdom, MD PhD [facility]

## 2021-02-22 ENCOUNTER — Other Ambulatory Visit: Payer: Self-pay | Admitting: Internal Medicine
